# Patient Record
Sex: Female | Born: 1945
Health system: Southern US, Community
[De-identification: ages and names within clinical notes are randomized; demographics above are authoritative.]

## PROBLEM LIST (undated history)

## (undated) DIAGNOSIS — E039 Hypothyroidism, unspecified: Secondary | ICD-10-CM

## (undated) DIAGNOSIS — Z5189 Encounter for other specified aftercare: Secondary | ICD-10-CM

## (undated) DIAGNOSIS — I10 Essential (primary) hypertension: Secondary | ICD-10-CM

## (undated) DIAGNOSIS — F329 Major depressive disorder, single episode, unspecified: Secondary | ICD-10-CM

## (undated) DIAGNOSIS — K589 Irritable bowel syndrome without diarrhea: Secondary | ICD-10-CM

## (undated) DIAGNOSIS — R7611 Nonspecific reaction to tuberculin skin test without active tuberculosis: Secondary | ICD-10-CM

## (undated) DIAGNOSIS — M1990 Unspecified osteoarthritis, unspecified site: Secondary | ICD-10-CM

## (undated) DIAGNOSIS — F32A Depression, unspecified: Secondary | ICD-10-CM

## (undated) DIAGNOSIS — IMO0001 Reserved for inherently not codable concepts without codable children: Secondary | ICD-10-CM

## (undated) DIAGNOSIS — R413 Other amnesia: Principal | ICD-10-CM

## (undated) DIAGNOSIS — Z8349 Family history of other endocrine, nutritional and metabolic diseases: Secondary | ICD-10-CM

## (undated) DIAGNOSIS — F419 Anxiety disorder, unspecified: Secondary | ICD-10-CM

## (undated) DIAGNOSIS — B019 Varicella without complication: Secondary | ICD-10-CM

## (undated) HISTORY — DX: Major depressive disorder, single episode, unspecified: F32.9

## (undated) HISTORY — PX: ADENOIDECTOMY: SUR15

## (undated) HISTORY — DX: Nonspecific reaction to tuberculin skin test without active tuberculosis: R76.11

## (undated) HISTORY — DX: Other amnesia: R41.3

## (undated) HISTORY — DX: Unspecified osteoarthritis, unspecified site: M19.90

## (undated) HISTORY — DX: Family history of other endocrine, nutritional and metabolic diseases: Z83.49

## (undated) HISTORY — DX: Encounter for other specified aftercare: Z51.89

## (undated) HISTORY — PX: OTHER SURGICAL HISTORY: SHX169

## (undated) HISTORY — DX: Depression, unspecified: F32.A

## (undated) HISTORY — DX: Anxiety disorder, unspecified: F41.9

## (undated) HISTORY — DX: Reserved for inherently not codable concepts without codable children: IMO0001

## (undated) HISTORY — DX: Essential (primary) hypertension: I10

## (undated) HISTORY — PX: DILATION AND CURETTAGE OF UTERUS: SHX78

## (undated) HISTORY — DX: Irritable bowel syndrome, unspecified: K58.9

## (undated) HISTORY — DX: Varicella without complication: B01.9

---
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
## 1966-09-23 HISTORY — PX: THYROID SURGERY: SHX805

## 2003-08-07 ENCOUNTER — Ambulatory Visit (HOSPITAL_COMMUNITY): Admission: RE | Admit: 2003-08-07 | Discharge: 2003-08-07 | Payer: Self-pay | Admitting: Nephrology

## 2005-05-23 ENCOUNTER — Encounter: Admission: RE | Admit: 2005-05-23 | Discharge: 2005-05-23 | Payer: Self-pay | Admitting: Nephrology

## 2005-10-27 ENCOUNTER — Encounter: Admission: RE | Admit: 2005-10-27 | Discharge: 2005-10-27 | Payer: Self-pay | Admitting: Nephrology

## 2010-10-13 ENCOUNTER — Encounter: Payer: Self-pay | Admitting: Nephrology

## 2012-05-22 ENCOUNTER — Ambulatory Visit (INDEPENDENT_AMBULATORY_CARE_PROVIDER_SITE_OTHER): Payer: BC Managed Care – PPO | Admitting: Family Medicine

## 2012-05-22 ENCOUNTER — Encounter: Payer: Self-pay | Admitting: Family Medicine

## 2012-05-22 VITALS — BP 121/75 | HR 61 | Temp 98.2°F | Ht 63.5 in | Wt 172.3 lb

## 2012-05-22 DIAGNOSIS — E039 Hypothyroidism, unspecified: Secondary | ICD-10-CM

## 2012-05-22 DIAGNOSIS — I10 Essential (primary) hypertension: Secondary | ICD-10-CM

## 2012-05-22 DIAGNOSIS — F339 Major depressive disorder, recurrent, unspecified: Secondary | ICD-10-CM

## 2012-05-22 DIAGNOSIS — M2550 Pain in unspecified joint: Secondary | ICD-10-CM

## 2012-05-22 LAB — CBC WITH DIFFERENTIAL/PLATELET
Basophils Absolute: 0 10*3/uL (ref 0.0–0.1)
Basophils Relative: 0.3 % (ref 0.0–3.0)
Eosinophils Absolute: 0.1 10*3/uL (ref 0.0–0.7)
Eosinophils Relative: 2.6 % (ref 0.0–5.0)
HCT: 40.1 % (ref 36.0–46.0)
Hemoglobin: 13.3 g/dL (ref 12.0–15.0)
Lymphocytes Relative: 26.7 % (ref 12.0–46.0)
Lymphs Abs: 1.5 10*3/uL (ref 0.7–4.0)
MCHC: 33.2 g/dL (ref 30.0–36.0)
MCV: 89.3 fl (ref 78.0–100.0)
Monocytes Absolute: 0.4 10*3/uL (ref 0.1–1.0)
Monocytes Relative: 7.9 % (ref 3.0–12.0)
Neutro Abs: 3.4 10*3/uL (ref 1.4–7.7)
Neutrophils Relative %: 62.5 % (ref 43.0–77.0)
Platelets: 197 10*3/uL (ref 150.0–400.0)
RBC: 4.49 Mil/uL (ref 3.87–5.11)
RDW: 14.5 % (ref 11.5–14.6)
WBC: 5.4 10*3/uL (ref 4.5–10.5)

## 2012-05-22 LAB — HEPATIC FUNCTION PANEL
ALT: 14 U/L (ref 0–35)
AST: 18 U/L (ref 0–37)
Albumin: 3.7 g/dL (ref 3.5–5.2)
Alkaline Phosphatase: 64 U/L (ref 39–117)
Bilirubin, Direct: 0 mg/dL (ref 0.0–0.3)
Total Bilirubin: 0.5 mg/dL (ref 0.3–1.2)
Total Protein: 6.4 g/dL (ref 6.0–8.3)

## 2012-05-22 LAB — BASIC METABOLIC PANEL
BUN: 7 mg/dL (ref 6–23)
CO2: 29 mEq/L (ref 19–32)
Calcium: 9 mg/dL (ref 8.4–10.5)
Chloride: 103 mEq/L (ref 96–112)
Creatinine, Ser: 0.7 mg/dL (ref 0.4–1.2)
GFR: 83.21 mL/min (ref >60.00)
Glucose, Bld: 90 mg/dL (ref 70–99)
Potassium: 3.7 mEq/L (ref 3.5–5.1)
Sodium: 140 mEq/L (ref 135–145)

## 2012-05-22 LAB — LIPID PANEL
Cholesterol: 189 mg/dL (ref 0–200)
HDL: 44 mg/dL (ref >39.00)
LDL Cholesterol: 116 mg/dL — ABNORMAL HIGH (ref 0–99)
Total CHOL/HDL Ratio: 4
Triglycerides: 145 mg/dL (ref 0.0–149.0)
VLDL: 29 mg/dL (ref 0.0–40.0)

## 2012-05-22 LAB — TSH: TSH: 2.84 u[IU]/mL (ref 0.35–5.50)

## 2012-05-22 MED ORDER — CITALOPRAM HYDROBROMIDE 20 MG PO TABS: 20.0000 mg | ORAL_TABLET | Freq: Every day | ORAL | Status: DC

## 2012-05-22 NOTE — Progress Notes (Signed)
  Subjective:    Patient ID: Christina [name], female    DOB: 1946/07/30, 66 y.o.   MRN: [id number]  HPI New to establish.  Previous MD- Zenda Alpers [phone], [phone]).  Renal- [place].    HTN- chronic problem, on amlodipine 5 and atenolol 25mg .  Has been out of amlodipine for a couple of months.  Pt reports BP is labile due to 'whatever stress i'm under'.  Has recently lost 15 lbs.  BP well controlled in office.  No HA, CP, SOB, visual changes, edema.  Hypothyroid- s/p partial thyroidectomy.  Previously on [place] thyroid but has not been on meds since her MD left town.  + fatigue, denies cold intolerance.  + brittle nails, dry hair.  Depression- chronic problem, pt and sister report pt is 'just a negative person'.  Not currently on meds.  Has previously taken meds but can't remember names- sister thinks it was celexa.  Pt and sister feel that mood was improved on Celexa.  Joint pains- currently pt is having L hip pain that is radiating into knee and ankle.  Pain is not constant but is worse in AM and evening.  Using infrared w/ good pain relief.  Will occasionally take ibuprofen 'but it has to get real bad'.  Refuses to take tylenol due to side effects.  Health maintenance- overdue on mammo, colonoscopy, pap, DEXA.  Is open to scheduling these.   Review of Systems For ROS see HPI     Objective:   Physical Exam  Constitutional: She is oriented to person, place, and time. She appears well-developed and well-nourished. No distress.  HENT:  Head: Normocephalic and atraumatic.  Eyes: Conjunctivae and EOM are normal. Pupils are equal, round, and reactive to light.  Neck: Normal range of motion. Neck supple. No thyromegaly present.  Cardiovascular: Normal rate, regular rhythm, normal heart sounds and intact distal pulses.   No murmur heard. Pulmonary/Chest: Effort normal and breath sounds normal. No respiratory distress.  Abdominal: Soft. She exhibits no distension. There is no  tenderness.  Musculoskeletal: She exhibits no edema.  Lymphadenopathy:    She has no cervical adenopathy.  Neurological: She is alert and oriented to person, place, and time.  Skin: Skin is warm and dry.  Psychiatric: Her behavior is normal.       Odd affect.  Defers to her sister to answer questions.          Assessment & Plan:

## 2012-05-22 NOTE — Patient Instructions (Addendum)
Follow up in 4-6 weeks to recheck your mood Start the Celexa daily We'll notify you of your lab results and determine the best med to take for your joint pains Based on your labs we'll determine whether you need to resume thyroid medication Continue the Atenolol- hold the Amlodipine since your BP looked so good Call with any questions or concerns Happy Early [place] Ouch!

## 2012-05-29 NOTE — Assessment & Plan Note (Signed)
New to provider, ongoing for pt.  Sister feels that SSRI would be beneficial in controlling pt's sxs and improving quality of life.  Will start low dose med and follow closely.

## 2012-05-29 NOTE — Assessment & Plan Note (Signed)
New to provider, chronic for pt.  Taking Atenolol.  Previously on amlodipine but has been out of this and BP remains at goal.  Asymptomatic.  No changes at this time.  Will continue to follow at future visits.

## 2012-05-29 NOTE — Assessment & Plan Note (Signed)
New to provider, recurrent for pt.  Pt unwilling to take tylenol due to risk of liver failure.  Reviewed that NSAIDs can impact kidneys if taken improperly and reviewed safe doses of tylenol and that when taken this way, she has nothing to worry about.  Will follow at future visits.

## 2012-05-29 NOTE — Assessment & Plan Note (Signed)
New to provider, chronic for pt since partial thyroidectomy.  Check labs and determine whether thyroid replacement is necessary.

## 2012-06-26 ENCOUNTER — Ambulatory Visit: Payer: BC Managed Care – PPO | Admitting: Family Medicine

## 2012-10-15 ENCOUNTER — Emergency Department (HOSPITAL_COMMUNITY)
Admission: EM | Admit: 2012-10-15 | Discharge: 2012-10-15 | Disposition: A | Discharge status: Home / Self Care | Payer: Worker's Compensation | Attending: Emergency Medicine | Admitting: Emergency Medicine

## 2012-10-15 ENCOUNTER — Emergency Department (HOSPITAL_COMMUNITY): Payer: Worker's Compensation

## 2012-10-15 ENCOUNTER — Encounter (HOSPITAL_COMMUNITY): Payer: Self-pay | Admitting: *Deleted

## 2012-10-15 DIAGNOSIS — Z8739 Personal history of other diseases of the musculoskeletal system and connective tissue: Secondary | ICD-10-CM | POA: Insufficient documentation

## 2012-10-15 DIAGNOSIS — W010XXA Fall on same level from slipping, tripping and stumbling without subsequent striking against object, initial encounter: Secondary | ICD-10-CM | POA: Insufficient documentation

## 2012-10-15 DIAGNOSIS — Z7982 Long term (current) use of aspirin: Secondary | ICD-10-CM | POA: Insufficient documentation

## 2012-10-15 DIAGNOSIS — Z79899 Other long term (current) drug therapy: Secondary | ICD-10-CM | POA: Insufficient documentation

## 2012-10-15 DIAGNOSIS — F3289 Other specified depressive episodes: Secondary | ICD-10-CM | POA: Insufficient documentation

## 2012-10-15 DIAGNOSIS — M25469 Effusion, unspecified knee: Secondary | ICD-10-CM | POA: Insufficient documentation

## 2012-10-15 DIAGNOSIS — F329 Major depressive disorder, single episode, unspecified: Secondary | ICD-10-CM | POA: Insufficient documentation

## 2012-10-15 DIAGNOSIS — Z8611 Personal history of tuberculosis: Secondary | ICD-10-CM | POA: Insufficient documentation

## 2012-10-15 DIAGNOSIS — S82109A Unspecified fracture of upper end of unspecified tibia, initial encounter for closed fracture: Secondary | ICD-10-CM | POA: Insufficient documentation

## 2012-10-15 DIAGNOSIS — S82102A Unspecified fracture of upper end of left tibia, initial encounter for closed fracture: Secondary | ICD-10-CM

## 2012-10-15 DIAGNOSIS — M25462 Effusion, left knee: Secondary | ICD-10-CM

## 2012-10-15 DIAGNOSIS — Y939 Activity, unspecified: Secondary | ICD-10-CM | POA: Insufficient documentation

## 2012-10-15 DIAGNOSIS — Z8619 Personal history of other infectious and parasitic diseases: Secondary | ICD-10-CM | POA: Insufficient documentation

## 2012-10-15 DIAGNOSIS — Y929 Unspecified place or not applicable: Secondary | ICD-10-CM | POA: Insufficient documentation

## 2012-10-15 MED ORDER — TRAMADOL HCL 50 MG PO TABS: 50.0000 mg | ORAL_TABLET | Freq: Four times a day (QID) | ORAL | Status: DC | PRN

## 2012-10-15 NOTE — ED Notes (Signed)
Patient transported to X-ray 

## 2012-10-15 NOTE — ED Notes (Signed)
To ED for eval of left knee pain since last PM. Pt tripped over box and fell. Ambulatory with pain. Minimal to moderate swelling noted. Denies further injury

## 2012-10-15 NOTE — ED Notes (Signed)
Returned from xray

## 2012-10-15 NOTE — ED Provider Notes (Signed)
Medical screening examination/treatment/procedure(s) were performed by non-physician practitioner and as supervising physician I was immediately available for consultation/collaboration.   Devonne Kitchen III, MD 10/15/12 1958 

## 2012-10-15 NOTE — ED Provider Notes (Signed)
History     CSN: [id number]  Arrival date & time 10/15/12  4098   First MD Initiated Contact with Patient 10/15/12 1101      Chief Complaint  Patient presents with  . Knee Pain    (Consider location/radiation/quality/duration/timing/severity/associated sxs/prior treatment) Patient is a 67 y.o. female presenting with knee pain. The history is provided by the patient. No language interpreter was used.  Knee Pain This is a new problem. The current episode started yesterday. The problem occurs constantly. The problem has been gradually worsening. Associated symptoms include arthralgias. Pertinent negatives include no abdominal pain, chest pain, fever, neck pain, rash or weakness. The symptoms are aggravated by standing and walking. She has tried NSAIDs and rest for the symptoms. The treatment provided mild relief.      Past Medical History  Diagnosis Date  . Arthritis   . Chicken pox   . Depression   . Positive TB test   . Blood transfusion   . Family history of thyroid problem     Past Surgical History  Procedure Date  . Tonsillectomy 1951  . Thyroid surgery 1968    Family History  Problem Relation Age of Onset  . Arthritis Mother   . Hyperlipidemia Mother   . Hypertension Mother   . Arthritis Father   . Prostate cancer Father   . Hypertension Father   . Arthritis Maternal Grandmother   . Hypertension Maternal Grandmother   . Arthritis Maternal Grandfather   . Hypertension Maternal Grandfather   . Arthritis Paternal Grandmother   . Hypertension Paternal Grandmother   . Arthritis Paternal Grandfather   . Hypertension Paternal Grandfather   . Diabetes Paternal Grandfather     History  Substance Use Topics  . Smoking status: Never Smoker   . Smokeless tobacco: Not on file  . Alcohol Use: Yes     Comment: occasionally    OB History    Grav Para Term Preterm Abortions TAB SAB Ect Mult Living                  Review of Systems  Constitutional: Negative  for fever.       10 Systems reviewed and all are negative for acute change except as noted in the HPI.   HENT: Negative for neck pain.   Cardiovascular: Negative for chest pain.  Gastrointestinal: Negative for abdominal pain.  Musculoskeletal: Positive for arthralgias.  Skin: Negative for rash.  Neurological: Negative for weakness.    Allergies  Celexa; Synthroid; and Valium  Home Medications   Current Outpatient Rx  Name  Route  Sig  Dispense  Refill  . ACAI 500 MG PO CAPS   Oral   Take 2 capsules by mouth daily.         . ASPIRIN 325 MG PO TABS   Oral   Take 325 mg by mouth daily.         . ATENOLOL 25 MG PO TABS   Oral   Take 25 mg by mouth daily.         Marland Kitchen BETA CAROTENE 15 MG PO CAPS   Oral   Take 15 mg by mouth daily.         Marland Kitchen VITAMIN D 1000 UNITS PO TABS   Oral   Take 1,000 Units by mouth daily.         Marland Kitchen CINNAMON 500 MG PO CAPS   Oral   Take 1,000 mg by mouth daily.         Marland Kitchen  FOLIC ACID 800 MCG PO TABS   Oral   Take 800 mcg by mouth daily.          . CULTURELLE DIGESTIVE HEALTH PO   Oral   Take 1 capsule by mouth.         . MELATONIN 3 MG PO CAPS   Oral   Take 1 capsule by mouth daily.         . ADRENAL PO   Oral   Take 1 capsule by mouth daily.         . ADULT MULTIVITAMIN W/MINERALS CH   Oral   Take 1 tablet by mouth daily.         . SELENIUM 50 MCG PO TABS   Oral   Take 200 mcg by mouth daily.         Marland Kitchen VITAMIN B-1 100 MG PO TABS   Oral   Take 100 mg by mouth daily.         . THYROID PO   Oral   Take 1 capsule by mouth.         Marland Kitchen VITAMIN E 400 UNITS PO CAPS   Oral   Take 400 Units by mouth daily.           BP 124/83  Pulse 68  Temp 98.1 F (36.7 C) (Oral)  Resp 18  SpO2 97%  Physical Exam  Nursing note and vitals reviewed. Constitutional: She is oriented to person, place, and time. She appears well-developed and well-nourished. No distress.  HENT:  Head: Atraumatic.  Eyes: Conjunctivae  normal are normal.  Neck: Neck supple.  Musculoskeletal:       Left hip: Normal.       Left knee: She exhibits decreased range of motion, swelling and effusion. She exhibits no ecchymosis, no deformity, no laceration, no erythema and normal patellar mobility. tenderness found.       Left ankle: Normal.  Neurological: She is alert and oriented to person, place, and time.  Skin: Skin is warm. No rash noted.  Psychiatric: She has a normal mood and affect.    ED Course  Procedures (including critical care time)  Labs Reviewed - No data to display Dg Knee Complete 4 Views Left  10/15/2012  *RADIOLOGY REPORT*  Clinical Data: Left knee pain and swelling secondary to a fall.  LEFT KNEE - COMPLETE 4+ VIEW  Comparison: None.  Findings: There is a large avulsion from the posterior aspect of the proximal tibia seen only on the lateral view.  There is a prominent joint effusion.  No other abnormalities.  IMPRESSION: Avulsion fracture of the posterior aspect of the proximal tibia. Large joint effusion.   Original Report Authenticated By: Francene Boyers, M.D.      No diagnosis found.  1. L tibia fracture 2. L knee effusion  MDM  To ED for eval of left knee pain since last PM. Pt tripped over box and fell. Ambulatory with pain. Minimal to moderate swelling noted. Denies further injury  12:19 PM Did not hit head, no LOC, no precipitating sxs.  Able to ambulate, took aspirin  Xray of L knee reviewed by me shows avulsion fx of the posterior aspect of the proximal tibia with large joint effusion.    Result were discussed with pt.  Pain medication offered, pt declined.  ICE pack given.  Will offer knee immobilizer and crutches with ortho f/u.    BP 139/73  Pulse 69  Temp 98.7 F (37.1 C) (  Oral)  Resp 16  SpO2 96%  I have reviewed nursing notes and vital signs. I personally reviewed the imaging tests through PACS system  I reviewed available ER/hospitalization records thought the  EMR       Fayrene Helper, [place] 10/15/12 1248

## 2012-10-15 NOTE — Progress Notes (Signed)
Orthopedic Tech Progress Note Patient Details:  Christina [name] 09-07-1946 [id number] Ace wrap and knee immobilizer applied to Left LE. Crutches fitted for height and comfort.  Ortho Devices Type of Ortho Device: Ace wrap;Crutches;Knee Immobilizer Ortho Device/Splint Location: Left Ortho Device/Splint Interventions: Application   Asia R Thompson 10/15/2012, 1:06 PM

## 2013-12-15 ENCOUNTER — Encounter: Payer: Self-pay | Admitting: Neurology

## 2013-12-16 ENCOUNTER — Ambulatory Visit (INDEPENDENT_AMBULATORY_CARE_PROVIDER_SITE_OTHER): Payer: BC Managed Care – PPO | Admitting: Neurology

## 2013-12-16 ENCOUNTER — Encounter (INDEPENDENT_AMBULATORY_CARE_PROVIDER_SITE_OTHER): Payer: Self-pay

## 2013-12-16 ENCOUNTER — Encounter: Payer: Self-pay | Admitting: Neurology

## 2013-12-16 VITALS — BP 146/84 | HR 54 | Ht 64.0 in | Wt 164.0 lb

## 2013-12-16 DIAGNOSIS — R413 Other amnesia: Secondary | ICD-10-CM

## 2013-12-16 HISTORY — DX: Other amnesia: R41.3

## 2013-12-16 NOTE — Progress Notes (Signed)
Reason for visit: Memory disorder  Christina [name] is a 68 y.o. female  History of present illness:  Ms. Grussing is a 68 year old right-handed white female with a history of problems with memory dating back to work four years. The patient lives alone, but she has a friend who comes in and helps her. The patient indicates that she has developed issues with short-term and long-term memory. The patient has difficulty remembering events that occurred within the last week, but she also has problems remembering things that happened 20 years ago. The patient still operates a motor vehicle, and she may have some difficulty with directions. The patient is still working, and apparently is functioning relatively well in this regard. The patient misplaces things about the house, but she is able to do her finances without difficulty. The patient is able to manage her medications and appointments. The patient does have a history of fatigue that is almost lifelong. The patient indicates that she sleeps fairly well, but she indicates that she does not rest well at night. The patient denies any significant excessive daytime drowsiness, however. The patient does report some numbness in the hands more so than the feet. The patient denies any headaches, blackout episodes, or focal numbness or weakness of the extremities, with exception of the hand issue as above. The patient has some mild gait instability, no falls. The patient denies problems controlling the bowels or the bladder. She is sent to this office for further evaluation. Blood work to include a thyroid and a B12 level were unremarkable. The patient does report issues with depression for quite a number of years. The friend that accompanies her indicates that she will need help doing her hair, or straightening her clothes, as she appears to not be concerned about a disheveled appearance at times.  Past Medical History  Diagnosis Date  . Arthritis   . Chicken  pox   . Depression   . Positive TB test   . Blood transfusion   . Family history of thyroid problem   . HTN (hypertension)   . Memory deficits 12/16/2013    Past Surgical History  Procedure Laterality Date  . Tonsillectomy  1951  . Thyroid surgery  1968  . Adenoidectomy    . D [place]    . Dilation and curettage of uterus      Family History  Problem Relation Age of Onset  . Arthritis Mother   . Hyperlipidemia Mother   . Hypertension Mother   . Dementia Mother   . Arthritis Father   . Prostate cancer Father   . Hypertension Father   . Arthritis Maternal Grandmother   . Hypertension Maternal Grandmother   . Arthritis Maternal Grandfather   . Hypertension Maternal Grandfather   . Arthritis Paternal Grandmother   . Hypertension Paternal Grandmother   . Arthritis Paternal Grandfather   . Hypertension Paternal Grandfather   . Diabetes Paternal Grandfather   . Heart disease Sister     congenital heart disease  . Stroke Sister   . Dementia Sister   . Cancer - Colon Brother   . Stroke Brother   . Pulmonary embolism Brother   . Stroke Brother   . Gout Brother     Social history:  reports that she has never smoked. She has never used smokeless tobacco. She reports that she drinks alcohol. She reports that she does not use illicit drugs.  Medications:  Current Outpatient Prescriptions on File Prior to Visit  Medication Sig  Dispense Refill  . Acai 500 MG CAPS Take 2 capsules by mouth daily.      Marland Kitchen aspirin 325 MG tablet Take 325 mg by mouth daily as needed.       Marland Kitchen atenolol (TENORMIN) 25 MG tablet Take 25 mg by mouth daily.      . beta carotene 15 MG capsule Take 15 mg by mouth daily.      . cholecalciferol (VITAMIN D) 1000 UNITS tablet Take 2,000 Units by mouth daily.       . folic acid (FOLVITE) 453 MCG tablet Take 800 mcg by mouth daily.       . Lactobacillus-Inulin (CULTURELLE DIGESTIVE HEALTH PO) Take 1 capsule by mouth.      . Lecithin 1200 MG CAPS Take 1 capsule by  mouth daily.      . Melatonin 3 MG CAPS Take 1 capsule by mouth daily.      . Misc Natural Products (ADRENAL PO) Take 1 capsule by mouth daily.      . Multiple Vitamin (MULTIVITAMIN WITH MINERALS) TABS Take 1 tablet by mouth daily.      Marland Kitchen selenium 50 MCG TABS Take 200 mcg by mouth daily.      Marland Kitchen thiamine (VITAMIN B-1) 100 MG tablet Take 100 mg by mouth daily.      . THYROID PO Take 1 capsule by mouth.      . traMADol (ULTRAM) 50 MG tablet Take 1 tablet (50 mg total) by mouth every 6 (six) hours as needed for pain.  15 tablet  0  . vitamin E 400 UNIT capsule Take 400 Units by mouth daily.       No current facility-administered medications on file prior to visit.      Allergies  Allergen Reactions  . Celexa [Citalopram Hydrobromide] Other (See Comments)    GENERIC BRAND makes pt nervous  . Synthroid [Levothyroxine Sodium]     Shakes   . Valium [Diazepam]     Pt became addicted. Not an allergy    ROS:  Out of a complete 14 system review of symptoms, the patient complains only of the following symptoms, and all other reviewed systems are negative.  Weight gain, fatigue Swelling in the legs Hearing loss, ringing in the ears Snoring Diarrhea, constipation Easy bruising Feeling hot, cold, increased thirst Joint pain, joint swelling, muscle cramps, achy muscles Allergies, runny nose Memory loss, confusion, numbness Depression, anxiety, insomnia, disinterest in activities, suicidal thoughts, racing thoughts Sleepiness  Blood pressure 146/84, pulse 54, height 5\' 4"  (1.626 m), weight 164 lb (74.39 kg).  Physical Exam  General: The patient is alert and cooperative at the time of the examination.  Eyes: Pupils are equal, round, and reactive to light. Discs are flat bilaterally.  Neck: The neck is supple, no carotid bruits are noted.  Respiratory: The respiratory examination is clear.  Cardiovascular: The cardiovascular examination reveals a regular rate and rhythm, no obvious  murmurs or rubs are noted.  Skin: Extremities are without significant edema.  Neurologic Exam  Mental status: The patient is alert and oriented x 3 at the time of the examination. The patient has apparent normal recent and remote memory, with an apparently normal attention span and concentration ability. Mini-Mental status examination done today shows a total score of 28/30.  Cranial nerves: Facial symmetry is present. There is good sensation of the face to pinprick and soft touch bilaterally. The strength of the facial muscles and the muscles to head turning and shoulder shrug  are normal bilaterally. Speech is well enunciated, no aphasia or dysarthria is noted. Extraocular movements are full. Visual fields are full. The tongue is midline, and the patient has symmetric elevation of the soft palate. No obvious hearing deficits are noted.  Motor: The motor testing reveals 5 over 5 strength of all 4 extremities. Good symmetric motor tone is noted throughout.  Sensory: Sensory testing is intact to pinprick, soft touch, vibration sensation, and position sense on all 4 extremities. No evidence of extinction is noted.  Coordination: Cerebellar testing reveals good finger-nose-finger and heel-to-shin bilaterally.  Gait and station: Gait is normal. Tandem gait is normal. Romberg is negative. No drift is seen.  Reflexes: Deep tendon reflexes are symmetric and normal bilaterally. Toes are downgoing bilaterally.   Assessment/Plan:  1. Reported memory disorder  2. Underlying depression  The patient has an atypical presentation for dementia. The patient has short-term and long-term memory issues, and clinical examination shows no evidence of apraxia that is commonly present with an organic memory disorder. 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 The patient will be set up for MRI evaluation of the brain, and she will be sent for neuropsychological testing. The patient will followup in 6  months. We will follow her memory issues over time.  Jill Alexanders MD 12/16/2013 9:01 AM  [facility] [address] [place], [place] [address]  Phone [phone] Fax [phone]

## 2014-02-10 ENCOUNTER — Ambulatory Visit
Admission: RE | Admit: 2014-02-10 | Discharge: 2014-02-10 | Disposition: A | Discharge status: Home / Self Care | Payer: BC Managed Care – PPO | Source: Ambulatory Visit | Attending: Neurology | Admitting: Neurology

## 2014-02-10 DIAGNOSIS — R413 Other amnesia: Secondary | ICD-10-CM

## 2014-02-11 ENCOUNTER — Telehealth: Payer: Self-pay | Admitting: Neurology

## 2014-02-11 NOTE — Telephone Encounter (Signed)
I called patient. The MRI of the brain shows minimal small vessel changes. The patient was sent for neuropsychological evaluation, I have not gotten the results of this yet.   MRI brain 02/10/14:  Impression   Slightly abnormal MRI scan of the brain showing mild changes  of chronic microvascular ischemia. These appears slightly progressed  compared with his MRI scan dated 10/27/2005

## 2014-04-07 DIAGNOSIS — R413 Other amnesia: Secondary | ICD-10-CM | POA: Diagnosis not present

## 2014-04-28 DIAGNOSIS — R413 Other amnesia: Secondary | ICD-10-CM | POA: Diagnosis not present

## 2014-05-10 ENCOUNTER — Telehealth: Payer: Self-pay | Admitting: Neurology

## 2014-05-10 NOTE — Telephone Encounter (Signed)
The neuropsychological evaluation done shows no significant cognitive abnormalities, there is some suggestion of significant underlying psychological distress, and psychiatric followup was recommended.  If the patient desires to have psychological or psychiatric evaluation and followup, she is to contact our office.

## 2014-06-23 ENCOUNTER — Encounter (INDEPENDENT_AMBULATORY_CARE_PROVIDER_SITE_OTHER): Payer: Self-pay

## 2014-06-23 ENCOUNTER — Ambulatory Visit (INDEPENDENT_AMBULATORY_CARE_PROVIDER_SITE_OTHER): Payer: BC Managed Care – PPO | Admitting: Adult Health

## 2014-06-23 ENCOUNTER — Encounter: Payer: Self-pay | Admitting: Adult Health

## 2014-06-23 ENCOUNTER — Encounter: Payer: Self-pay | Admitting: *Deleted

## 2014-06-23 VITALS — BP 171/100 | HR 56 | Ht 64.0 in | Wt 163.0 lb

## 2014-06-23 DIAGNOSIS — R413 Other amnesia: Secondary | ICD-10-CM

## 2014-06-23 NOTE — Patient Instructions (Signed)
We have made a referral to psychiatry for an underlying psychological distress. Our office will call you with an appointment

## 2014-06-23 NOTE — Progress Notes (Signed)
PATIENT: Christina [name] DOB: 02/28/1946  REASON FOR VISIT: follow up HISTORY FROM: patient  HISTORY OF PRESENT ILLNESS: Christina [name] is a 68 year old female with a history of memory loss. She returns today for follow-up. She had an MRI that showed minimal small vessel changes. She was also sent for neuropsychological testing that indicated a possible underlying psychological distress. She states that her memory improves for a while and then it will worsen. She states that she has a lot of stress at her job. She continues to live alone. She does her own finances without difficulty. She is able to complete all ADLs without difficulty. Denies having to give up doing anything due to her memory. She operates a Teacher, music without difficulty.    HISTORY 12/16/13 (CW): 68 year old right-handed white female with a history of problems with memory dating back to work four years. The patient lives alone, but she has a friend who comes in and helps her. The patient indicates that she has developed issues with short-term and long-term memory. The patient has difficulty remembering events that occurred within the last week, but she also has problems remembering things that happened 20 years ago. The patient still operates a motor vehicle, and she may have some difficulty with directions. The patient is still working, and apparently is functioning relatively well in this regard. The patient misplaces things about the house, but she is able to do her finances without difficulty. The patient is able to manage her medications and appointments. The patient does have a history of fatigue that is almost lifelong. The patient indicates that she sleeps fairly well, but she indicates that she does not rest well at night. The patient denies any significant excessive daytime drowsiness, however. The patient does report some numbness in the hands more so than the feet. The patient denies any headaches, blackout episodes, or  focal numbness or weakness of the extremities, with exception of the hand issue as above. The patient has some mild gait instability, no falls. The patient denies problems controlling the bowels or the bladder. She is sent to this office for further evaluation. Blood work to include a thyroid and a B12 level were unremarkable. The patient does report issues with depression for quite a number of years. The friend that accompanies her indicates that she will need help doing her hair, or straightening her clothes, as she appears to not be concerned about a disheveled appearance at times.   REVIEW OF SYSTEMS: Full 14 system review of systems performed and notable only for:  Constitutional: Fatigue Eyes: N/A Ear/Nose/Throat: Hearing loss, ringing in the ears  Skin: N/A  Cardiovascular: Leg swelling  Respiratory: N/A  Gastrointestinal: Constipation, diarrhea  Genitourinary: N/A Hematology/Lymphatic: N/A  Endocrine: Excessive eating Musculoskeletal: Joint pain, joint swelling, aching muscles, muscle cramps Allergy/Immunology: N/A  Neurological: Memory loss, numbness Psychiatric: Agitation, behavior problem, confusion, decreased concentration, depression, nervous/anxious Sleep: Daytime sleepiness, snoring   ALLERGIES: Allergies  Allergen Reactions  . Celexa [Citalopram Hydrobromide] Other (See Comments)    GENERIC BRAND makes pt nervous  . Synthroid [Levothyroxine Sodium]     Shakes   . Valium [Diazepam]     Pt became addicted. Not an allergy    HOME MEDICATIONS: Outpatient Prescriptions Prior to Visit  Medication Sig Dispense Refill  . Acai 500 MG CAPS Take 2 capsules by mouth daily.      Marland Kitchen aspirin 325 MG tablet Take 325 mg by mouth daily as needed.       Marland Kitchen  atenolol (TENORMIN) 25 MG tablet Take 25 mg by mouth daily.      . beta carotene 15 MG capsule Take 15 mg by mouth daily.      . cholecalciferol (VITAMIN D) 1000 UNITS tablet Take 2,000 Units by mouth daily.       . folic acid  (FOLVITE) 161 MCG tablet Take 800 mcg by mouth daily.       . Lactobacillus-Inulin (CULTURELLE DIGESTIVE HEALTH PO) Take 1 capsule by mouth.      . Lecithin 1200 MG CAPS Take 1 capsule by mouth daily.      . Melatonin 3 MG CAPS Take 1 capsule by mouth daily.      . Misc Natural Products (ADRENAL PO) Take 1 capsule by mouth daily.      . Multiple Vitamin (MULTIVITAMIN WITH MINERALS) TABS Take 1 tablet by mouth daily.      Marland Kitchen selenium 50 MCG TABS Take 200 mcg by mouth daily.      Marland Kitchen thiamine (VITAMIN B-1) 100 MG tablet Take 100 mg by mouth daily.      . THYROID PO Take 1 capsule by mouth.      . traMADol (ULTRAM) 50 MG tablet Take 1 tablet (50 mg total) by mouth every 6 (six) hours as needed for pain.  15 tablet  0  . vitamin E 400 UNIT capsule Take 400 Units by mouth daily.       No facility-administered medications prior to visit.    PAST MEDICAL HISTORY: Past Medical History  Diagnosis Date  . Arthritis   . Chicken pox   . Depression   . Positive TB test   . Blood transfusion   . Family history of thyroid problem   . HTN (hypertension)   . Memory deficits 12/16/2013  . Irritable bowel syndrome   . Degenerative arthritis     PAST SURGICAL HISTORY: Past Surgical History  Procedure Laterality Date  . Tonsillectomy  1951  . Thyroid surgery  1968  . Adenoidectomy    . D [place]    . Dilation and curettage of uterus      FAMILY HISTORY: Family History  Problem Relation Age of Onset  . Arthritis Mother   . Hyperlipidemia Mother   . Hypertension Mother   . Dementia Mother   . Arthritis Father   . Prostate cancer Father   . Hypertension Father   . Arthritis Maternal Grandmother   . Hypertension Maternal Grandmother   . Arthritis Maternal Grandfather   . Hypertension Maternal Grandfather   . Arthritis Paternal Grandmother   . Hypertension Paternal Grandmother   . Arthritis Paternal Grandfather   . Hypertension Paternal Grandfather   . Diabetes Paternal Grandfather   . Heart  disease Sister     congenital heart disease  . Stroke Sister   . Dementia Sister   . Cancer - Colon Brother   . Stroke Brother   . Pulmonary embolism Brother   . Stroke Brother   . Gout Brother     SOCIAL HISTORY: History   Social History  . Marital Status: Divorced    Spouse Name: N/A    Number of Children: 63  . Years of Education: college 1   Occupational History  . FLOATER    Social History Main Topics  . Smoking status: Never Smoker   . Smokeless tobacco: Never Used  . Alcohol Use: Yes     Comment: occasionally  . Drug Use: No  . Sexual Activity: Not  on file   Other Topics Concern  . Not on file   Social History Narrative  . No narrative on file      PHYSICAL EXAM  Filed Vitals:   06/23/14 0826  BP: 171/100  Pulse: 56  Height: 5\' 4"  (1.626 m)  Weight: 163 lb (73.936 kg)   Body mass index is 27.97 kg/(m^2).  Generalized: Well developed, in no acute distress   Neurological examination  Mentation: Alert oriented to time, place, history taking. Follows all commands speech and language fluent. MMSE 29/30 Cranial nerve II-XII: Pupils were equal round reactive to light. Extraocular movements were full, visual field were full on confrontational test. Facial sensation and strength were normal. Uvula tongue midline. Head turning and shoulder shrug  were normal and symmetric. Motor: The motor testing reveals 5 over 5 strength of all 4 extremities. Good symmetric motor tone is noted throughout.  Sensory: Sensory testing is intact to soft touch on all 4 extremities. No evidence of extinction is noted.  Coordination: Cerebellar testing reveals good finger-nose-finger and heel-to-shin bilaterally.  Gait and station: Gait is normal. Tandem gait is normal. Romberg is negative. No drift is seen.  Reflexes: Deep tendon reflexes are symmetric and normal bilaterally.  Marland Kitchen   DIAGNOSTIC DATA (LABS, IMAGING, TESTING) - I reviewed patient records, labs, notes, testing and  imaging myself where available.  Lab Results  Component Value Date   WBC 5.4 05/22/2012   HGB 13.3 05/22/2012   HCT 40.1 05/22/2012   MCV 89.3 05/22/2012   PLT 197.0 05/22/2012      Component Value Date/Time   NA 140 05/22/2012 0916   K 3.7 05/22/2012 0916   CL 103 05/22/2012 0916   CO2 29 05/22/2012 0916   GLUCOSE 90 05/22/2012 0916   BUN 7 05/22/2012 0916   CREATININE 0.7 05/22/2012 0916   CALCIUM 9.0 05/22/2012 0916   PROT 6.4 05/22/2012 0916   ALBUMIN 3.7 05/22/2012 0916   AST 18 05/22/2012 0916   ALT 14 05/22/2012 0916   ALKPHOS 64 05/22/2012 0916   BILITOT 0.5 05/22/2012 0916   Lab Results  Component Value Date   CHOL 189 05/22/2012   HDL 44.00 05/22/2012   LDLCALC 116* 05/22/2012   TRIG 145.0 05/22/2012   CHOLHDL 4 05/22/2012    Lab Results  Component Value Date   TSH 2.84 05/22/2012      ASSESSMENT AND PLAN 68 y.o. year old female  has a past medical history of Arthritis; Chicken pox; Depression; Positive TB test; Blood transfusion; Family history of thyroid problem; HTN (hypertension); Memory deficits (12/16/2013); Irritable bowel syndrome; and Degenerative arthritis. here with:  1. memory disturbance  Patient continues to feel that her memory is not always "the best". She did have an MRI that showed minimal small vessel disease. She had neuropsychological testing that indicated a possible underlying psychological distress. The recommendation from the neuropsychological testing was a psychiatric evaluation. I have discussed this with the patient today and she is amendable to being referred to psychiatry. The patient will followup with [place] in 6 months. If her symptoms worsen or she develops new symptoms she should let us know.  Ward Givens, MSN, NP-C 06/23/2014, 8:31 AM [facility] [address], [place], [place] [address] [phone]  Note: This document was prepared with digital dictation and possible smart phrase technology. Any transcriptional  errors that result from this process are unintentional.

## 2014-06-23 NOTE — Progress Notes (Signed)
I have read the note, and I agree with the clinical assessment and plan.  Maclean Foister KEITH   

## 2014-08-10 ENCOUNTER — Encounter: Payer: Self-pay | Admitting: Neurology

## 2014-08-16 ENCOUNTER — Encounter: Payer: Self-pay | Admitting: Neurology

## 2014-12-26 ENCOUNTER — Ambulatory Visit (INDEPENDENT_AMBULATORY_CARE_PROVIDER_SITE_OTHER): Payer: 59 | Admitting: Adult Health

## 2014-12-26 ENCOUNTER — Encounter: Payer: Self-pay | Admitting: Adult Health

## 2014-12-26 VITALS — BP 165/99 | HR 63 | Ht 64.0 in | Wt 168.0 lb

## 2014-12-26 DIAGNOSIS — R413 Other amnesia: Secondary | ICD-10-CM | POA: Diagnosis not present

## 2014-12-26 NOTE — Progress Notes (Signed)
PATIENT: Christina [name] DOB: 04-19-1946  REASON FOR VISIT: follow up- memory HISTORY FROM: patient  HISTORY OF PRESENT ILLNESS Christina [name] is a 69 year old female with a history of memory loss. She returns today for follow-up. The patient was referred to psychiatry after her neuropsychological evaluation. She states that she has an appointment with a therapist on Wednesday. She states that she cannot get appointment with a psychiatrist until October. Patient states that she has good and bad days with her memory. She continues to live alone. She is able to complete all ADLs independently. 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 She denies having to give up anything due to her memory. The patient was started on Wellbutrin since the last visit. She feels that this has helped her mood. She returns today for evaluation.   History 06/23/14:Christina [name] is a 69 year old female with a history of memory loss. She returns today for follow-up. She had an MRI that showed minimal small vessel changes. She was also sent for neuropsychological testing that indicated a possible underlying psychological distress. She states that her memory improves for a while and then it will worsen. She states that she has a lot of stress at her job. She continues to live alone. She does her own finances without difficulty. She is able to complete all ADLs without difficulty. Denies having to give up doing anything due to her memory. She operates a Teacher, music without difficulty.    HISTORY 12/16/13 (CW): 69 year old right-handed white female with a history of problems with memory dating back to work four years. The patient lives alone, but she has a friend who comes in and helps her. The patient indicates that she has developed issues with short-term and long-term memory. The patient has difficulty remembering events that occurred within the last week, but she also has problems  remembering things that happened 20 years ago. The patient still operates a motor vehicle, and she may have some difficulty with directions. The patient is still working, and apparently is functioning relatively well in this regard. The patient misplaces things about the house, but she is able to do her finances without difficulty. The patient is able to manage her medications and appointments. The patient does have a history of fatigue that is almost lifelong. The patient indicates that she sleeps fairly well, but she indicates that she does not rest well at night. The patient denies any significant excessive daytime drowsiness, however. The patient does report some numbness in the hands more so than the feet. The patient denies any headaches, blackout episodes, or focal numbness or weakness of the extremities, with exception of the hand issue as above. The patient has some mild gait instability, no falls. The patient denies problems controlling the bowels or the bladder. She is sent to this office for further evaluation. Blood work to include a thyroid and a B12 level were unremarkable. The patient does report issues with depression for quite a number of years. The friend that accompanies her indicates that she will need help doing her hair, or straightening her clothes, as she appears to not be concerned about a disheveled appearance at times.   REVIEW OF SYSTEMS: Out of a complete 14 system review of symptoms, the patient complains only of the following symptoms, and all other reviewed systems are negative.  Hemorrhoids, frequent waking, daytime sleepiness, snoring, leg swelling, agitation, behavior problem, confusion, decreased concentration, depression, nervous/anxious, memory loss,  tingling  ALLERGIES: Allergies  Allergen Reactions  . Celexa [Citalopram Hydrobromide] Other (See Comments)    GENERIC BRAND makes pt nervous  . Synthroid [Levothyroxine Sodium]     Shakes   . Valium [Diazepam]      Pt became addicted. Not an allergy    HOME MEDICATIONS: Outpatient Prescriptions Prior to Visit  Medication Sig Dispense Refill  . Acai 500 MG CAPS Take 2 capsules by mouth daily.    Marland Kitchen aspirin 325 MG tablet Take 325 mg by mouth daily as needed.     Marland Kitchen atenolol (TENORMIN) 25 MG tablet Take 25 mg by mouth daily.    . beta carotene 15 MG capsule Take 15 mg by mouth daily.    . cholecalciferol (VITAMIN D) 1000 UNITS tablet Take 2,000 Units by mouth daily.     . folic acid (FOLVITE) 240 MCG tablet Take 800 mcg by mouth daily.     . Lactobacillus-Inulin (CULTURELLE DIGESTIVE HEALTH PO) Take 1 capsule by mouth.    . Lecithin 1200 MG CAPS Take 1 capsule by mouth daily.    . Melatonin 3 MG CAPS Take 1 capsule by mouth daily.    . Misc Natural Products (ADRENAL PO) Take 1 capsule by mouth daily.    . Multiple Vitamin (MULTIVITAMIN WITH MINERALS) TABS Take 1 tablet by mouth daily.    Marland Kitchen selenium 50 MCG TABS Take 200 mcg by mouth daily.    Marland Kitchen thiamine (VITAMIN B-1) 100 MG tablet Take 100 mg by mouth daily.    . THYROID PO Take 1 capsule by mouth.    . traMADol (ULTRAM) 50 MG tablet Take 1 tablet (50 mg total) by mouth every 6 (six) hours as needed for pain. 15 tablet 0  . vitamin E 400 UNIT capsule Take 400 Units by mouth daily.     No facility-administered medications prior to visit.    PAST MEDICAL HISTORY: Past Medical History  Diagnosis Date  . Arthritis   . Chicken pox   . Depression   . Positive TB test   . Blood transfusion   . Family history of thyroid problem   . HTN (hypertension)   . Memory deficits 12/16/2013  . Irritable bowel syndrome   . Degenerative arthritis     PAST SURGICAL HISTORY: Past Surgical History  Procedure Laterality Date  . Tonsillectomy  1951  . Thyroid surgery  1968  . Adenoidectomy    . D [place]    . Dilation and curettage of uterus      FAMILY HISTORY: Family History  Problem Relation Age of Onset  . Arthritis Mother   . Hyperlipidemia Mother   .  Hypertension Mother   . Dementia Mother   . Arthritis Father   . Prostate cancer Father   . Hypertension Father   . Arthritis Maternal Grandmother   . Hypertension Maternal Grandmother   . Arthritis Maternal Grandfather   . Hypertension Maternal Grandfather   . Arthritis Paternal Grandmother   . Hypertension Paternal Grandmother   . Arthritis Paternal Grandfather   . Hypertension Paternal Grandfather   . Diabetes Paternal Grandfather   . Heart disease Sister     congenital heart disease  . Stroke Sister   . Dementia Sister   . Cancer - Colon Brother   . Stroke Brother   . Pulmonary embolism Brother   . Stroke Brother   . Gout Brother     SOCIAL HISTORY: History   Social History  . Marital Status: Divorced  Spouse Name: N/A  . Number of Children: 4  . Years of Education: college 1   Occupational History  . FLOATER    Social History Main Topics  . Smoking status: Never Smoker   . Smokeless tobacco: Never Used  . Alcohol Use: Yes     Comment: occasionally  . Drug Use: No  . Sexual Activity: Not on file   Other Topics Concern  . Not on file   Social History Narrative  . No narrative on file      PHYSICAL EXAM  Filed Vitals:   12/26/14 0826  BP: 165/99  Pulse: 63  Height: 5\' 4"  (1.626 m)  Weight: 168 lb (76.204 kg)   Body mass index is 28.82 kg/(m^2).  Generalized: Well developed, in no acute distress   Neurological examination  Mentation: Alert oriented to time, place, history taking. Follows all commands speech and language fluent. MMSE 29/30 Cranial nerve II-XII: Pupils were equal round reactive to light. Extraocular movements were full, visual field were full on confrontational test. Facial sensation and strength were normal. Uvula tongue midline. Head turning and shoulder shrug  were normal and symmetric. Motor: The motor testing reveals 5 over 5 strength of all 4 extremities. Good symmetric motor tone is noted throughout.  Sensory: Sensory  testing is intact to soft touch on all 4 extremities. No evidence of extinction is noted.  Coordination: Cerebellar testing reveals good finger-nose-finger and heel-to-shin bilaterally.  Gait and station: Gait is normal. Tandem gait is normal. Romberg is negative. No drift is seen.  Reflexes: Deep tendon reflexes are symmetric and normal bilaterally.    DIAGNOSTIC DATA (LABS, IMAGING, TESTING) - I reviewed patient records, labs, notes, testing and imaging myself where available.      ASSESSMENT AND PLAN 69 y.o. year old female  has a past medical history of Arthritis; Chicken pox; Depression; Positive TB test; Blood transfusion; Family history of thyroid problem; HTN (hypertension); Memory deficits (12/16/2013); Irritable bowel syndrome; and Degenerative arthritis. here with:  1.memory loss   The patient's memory score has remained the same. Her MMSE is 29 out of 30. I have advised the patient to keep her appointment with her therapist. As of now medication for her memory is not warranted. If her symptoms worsen or she develops new symptoms she should let us know. Otherwise she will follow-up in 6 months or sooner if needed.    Ward Givens, MSN, NP-C 12/26/2014, 8:24 AM [facility] [address] [place], [place] [address] [phone]  Note: This document was prepared with digital dictation and possible smart phrase technology. Any transcriptional errors that result from this process are unintentional.

## 2014-12-26 NOTE — Patient Instructions (Signed)
Memory score is stable.  Keep appt with therapist.  If symptoms worsen please let us know.

## 2014-12-26 NOTE — Progress Notes (Signed)
I have read the note, and I agree with the clinical assessment and plan.  WILLIS,CHARLES KEITH   

## 2014-12-28 ENCOUNTER — Encounter (HOSPITAL_COMMUNITY): Payer: Self-pay | Admitting: Clinical

## 2014-12-28 ENCOUNTER — Encounter (HOSPITAL_COMMUNITY): Payer: Self-pay

## 2014-12-28 ENCOUNTER — Ambulatory Visit (INDEPENDENT_AMBULATORY_CARE_PROVIDER_SITE_OTHER): Payer: Medicare Other | Admitting: Clinical

## 2014-12-28 DIAGNOSIS — F4310 Post-traumatic stress disorder, unspecified: Secondary | ICD-10-CM

## 2014-12-28 DIAGNOSIS — F331 Major depressive disorder, recurrent, moderate: Secondary | ICD-10-CM | POA: Diagnosis not present
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
## 2015-01-05 ENCOUNTER — Ambulatory Visit (INDEPENDENT_AMBULATORY_CARE_PROVIDER_SITE_OTHER): Payer: Medicare Other | Admitting: Clinical

## 2015-01-05 DIAGNOSIS — F4310 Post-traumatic stress disorder, unspecified: Secondary | ICD-10-CM | POA: Diagnosis not present

## 2015-01-05 DIAGNOSIS — F331 Major depressive disorder, recurrent, moderate: Secondary | ICD-10-CM | POA: Diagnosis not present

## 2015-01-05 NOTE — Progress Notes (Signed)
   THERAPIST PROGRESS NOTE  Session Time: 6:55 - 8:00  Participation Level: Active  Behavioral Response: Casual and NeatAlertDepressed  Type of Therapy: Individual Therapy  Treatment Goals addressed: Improve psychiatric symptoms, Improve faulty thinking patterns  Interventions: CBT, Motivational Interviewing, grounding and mindfulness techniques  Summary: Christina [name] is a 69 y.o. female who presents with Major depressive disorder, recurrent episode, moderate and PTSD (post-traumatic stress disorder).  Suicidal/Homicidal: Nowithout intent/plan  Therapist Response: Christina [name] met with clinician for an individual session. Christina [name] discussed her psychiatric symptoms and her current life events. Christina [name] was accompanied by her friend, the friend was there for support and to observe. Christina [name] shared that her work was very stressful because of management issues as well as some of the staff dynamics. Christina [name] shared that she has been depressed and anxious. Clinician introduced grounding techniques. Clinician discussed the purpose and process. Client and clinician practiced some of the techniques together. Christina [name] shared some of her thoughts about changing her thought process at this age. Client and clinician discussed how changing and improving might affect her. Christina [name] agreed to practice daily until next session.  Plan: Return again in 1 weeks.  Diagnosis: Axis I: Major depressive disorder, recurrent episode, moderate and PTSD (post-traumatic stress disorder).        Taelor Waymire A, LCSW 01/05/2015

## 2015-01-07 ENCOUNTER — Encounter (HOSPITAL_COMMUNITY): Payer: Self-pay | Admitting: Clinical

## 2015-01-12 ENCOUNTER — Encounter (HOSPITAL_COMMUNITY): Payer: Self-pay | Admitting: Clinical

## 2015-01-12 ENCOUNTER — Ambulatory Visit (INDEPENDENT_AMBULATORY_CARE_PROVIDER_SITE_OTHER): Payer: Medicare Other | Admitting: Clinical

## 2015-01-12 DIAGNOSIS — F331 Major depressive disorder, recurrent, moderate: Secondary | ICD-10-CM | POA: Diagnosis not present

## 2015-01-12 DIAGNOSIS — F4310 Post-traumatic stress disorder, unspecified: Secondary | ICD-10-CM | POA: Diagnosis not present

## 2015-01-12 NOTE — Progress Notes (Signed)
   THERAPIST PROGRESS NOTE  Session Time: 7:00 -7:56  Participation Level: Active  Behavioral Response: CasualAlertNA  Type of Therapy: Individual Therapy  Treatment Goals addressed: Improve psychiatric symptoms, Improve faulty thinking patterns, emotional regulation skills  Interventions: CBT, Motivational Interviewing, grounding and mindfulness techniques  Summary: Christina [name] is a 69 y.o. female who presents with Major depressive disorder, recurrent episode, moderate and PTSD (post-traumatic stress disorder).  Suicidal/Homicidal: No - without intent/plan  Therapist Response: Christina [name] met with clinician for an individual session. She was accompanied by her friend who was there to support her. [name] discussed her psychiatric symptoms, her current life events and her homework. [name] shared about some of her stresses at work. [name] stated that she did not remember what she talked about last session. [name] shared that she had been practicing the 10x3 grounding technique but had not tried any of the other ones.Clinician introduced some cbt concepts. Client and clinician worked a 7 panel thought record sheet on an event that happened at work. Through the process client and clinician discussed how our thoughts inform our emotions, perceptions, and actions. Christina [name] and clinician agreed to continue the discussion further at future sessions. [name] agreed to complete grounding/mindfulness and cbt homework until next session.  Plan: Return again in 1 weeks.  Diagnosis:     Axis I: Major depressive disorder, recurrent episode, moderate and PTSD (post-traumatic stress disorder).    Lael Pilch A, LCSW 01/12/2015

## 2015-01-19 ENCOUNTER — Ambulatory Visit (INDEPENDENT_AMBULATORY_CARE_PROVIDER_SITE_OTHER): Payer: Medicare Other | Admitting: Clinical

## 2015-01-19 ENCOUNTER — Encounter (HOSPITAL_COMMUNITY): Payer: Self-pay | Admitting: Clinical

## 2015-01-19 DIAGNOSIS — F4310 Post-traumatic stress disorder, unspecified: Secondary | ICD-10-CM | POA: Diagnosis not present

## 2015-01-19 DIAGNOSIS — F331 Major depressive disorder, recurrent, moderate: Secondary | ICD-10-CM | POA: Diagnosis not present

## 2015-01-19 NOTE — Progress Notes (Signed)
   THERAPIST PROGRESS NOTE  Session Time: 7:00 - 8:02  Participation Level: Active  Behavioral Response: CasualAlertDepressed  Type of Therapy: Individual Therapy  Treatment Goals addressed: Improve psychiatric symptoms, Improve faulty thinking patterns, emotional regulation skills, stress reduction techniques  Interventions: CBT, Motivational Interviewing, grounding and mindfulness techniques  Summary: Christina [name] is a 69 y.o. female who presents with Major depressive disorder, recurrent episode, moderate and PTSD (post-traumatic stress disorder).  Suicidal/Homicidal: No - without intent/plan  Therapist Response: [name] met with clinician for an individual session. [name] was accompanied by her friend [name], for support.  [name] discussed her psychiatric symptoms, her current life events and her homework. [name] and clinician reviewed and discussed her homework. [name] and clinician discussed her grounding/mindfulness practice.  [name] reports that she has been practicing regularly and sometimes notices a difference and sometimes does not. Client and clinician discussed the techniques and the differing situations.Sawsan shared her thoughts and insights about her cbt homework.  [name] shared her depression symptom check list. [name] shared that she never allows herself to experience emotions, she also shared a  recurring thought she has at work "I should leave (or avoid). [name] and clinician used a cbt worksheet to examine the thought. [name] shared more in depth as  Client and clinician examined the evidence. [name] seemed to be resistant to the idea that she had the power to change how she experiences situations. [name] made such statements as "It is always like that." Client and clinician discussed her homework where she had pointed out that her negative predictions were most often wrong. [name] and clinician discussed the process of changing thoughts and how and why resistance works in all of [place]. [name] and clinician  discussed the difficulty of challenging ones core beliefs. Clinician supported [name] in taking the time to process the information and to continue to do her homework.  Plan: Return again in 1 weeks.  Diagnosis:     Axis I: Major depressive disorder, recurrent episode, moderate and PTSD (post-traumatic stress disorder).    Dezi Brauner A, LCSW 01/19/2015

## 2015-01-26 ENCOUNTER — Ambulatory Visit (INDEPENDENT_AMBULATORY_CARE_PROVIDER_SITE_OTHER): Payer: Medicare Other | Admitting: Clinical

## 2015-01-26 ENCOUNTER — Encounter (HOSPITAL_COMMUNITY): Payer: Self-pay | Admitting: Clinical

## 2015-01-26 DIAGNOSIS — F4310 Post-traumatic stress disorder, unspecified: Secondary | ICD-10-CM | POA: Diagnosis not present

## 2015-01-26 DIAGNOSIS — F331 Major depressive disorder, recurrent, moderate: Secondary | ICD-10-CM | POA: Diagnosis not present

## 2015-01-26 NOTE — Progress Notes (Signed)
   THERAPIST PROGRESS NOTE  Session Time: 7:02 -8:02  Participation Level: Active  Behavioral Response: CasualAlertDepressed  Type of Therapy: Individual Therapy  Treatment Goals addressed: Improve psychiatric symptoms, Improve faulty thinking patterns,   Interventions: CBT, Motivational Interviewing,   Summary: Christina [name] is a 70 y.o. female who presents with Major depressive disorder, recurrent episode, moderate and PTSD (post-traumatic stress disorder).  Suicidal/Homicidal: No - without intent/plan  Therapist Response: Christina [name] met with clinician for an individual session. [name] was accompanied by her friend Christina [name], for support.  [name] discussed her psychiatric symptoms, her current life events and her homework. [name] shared that she believed her memory was worse this past week. Client and clinician discussed what she noticed and what she thought the cause might be. [name] shared that she had completed her homework. She shared her thoughts and insights about her homework. She shared that she continues to benefit from practicing her grounding techniques. She shared her cbt homework. [name] shared a core belief about her not belonging or being connected to others. Client and clinician discussed how she felt when she thought the thought. [name] shared that it made her feel sad and distant. Christina [name] and clinician worked a cbt work sheet on he belief. [name] was able to identify evidence that did and did not support her belief. Christina [name] and clinician discussed the cost and benefit of challenging and changing core beliefs. [name] was not yet ready to change the belief. She shared that she had it since her son died. Client and clinician agreed to continue the discussion at a future date.  Plan: Return again in 1 weeks.  Diagnosis:     Axis I: Major depressive disorder, recurrent episode, moderate and PTSD (post-traumatic stress disorder).   [name] A, LCSW 01/26/2015

## 2015-02-02 ENCOUNTER — Ambulatory Visit (INDEPENDENT_AMBULATORY_CARE_PROVIDER_SITE_OTHER): Payer: Medicare Other | Admitting: Clinical

## 2015-02-02 ENCOUNTER — Encounter (HOSPITAL_COMMUNITY): Payer: Self-pay | Admitting: Clinical

## 2015-02-02 DIAGNOSIS — F331 Major depressive disorder, recurrent, moderate: Secondary | ICD-10-CM

## 2015-02-02 DIAGNOSIS — F4310 Post-traumatic stress disorder, unspecified: Secondary | ICD-10-CM | POA: Diagnosis not present

## 2015-02-02 NOTE — Progress Notes (Signed)
   THERAPIST PROGRESS NOTE  Session Time: 7:00 -7:58  Participation Level: Active  Behavioral Response: CasualAlerthappy and depressed (off and on) depending on what she was working on  Type of Therapy: Individual Therapy  Treatment Goals addressed: Improve psychiatric symptoms, Improve faulty thinking patterns, emotional regulation skills,  Interventions: CBT, Motivational Interviewing, grounding and mindfulness techniques  Summary: Christina [name] is a 69 y.o. female who presents with Major depressive disorder, recurrent episode, moderate and PTSD (post-traumatic stress disorder).  Suicidal/Homicidal: No - without intent/plan  Therapist Response: [name] met with clinician for an individual session. Christina [name] was accompanied by her friend Christina [name], for support.  Christina [name] discussed her psychiatric symptoms, her current life events and her homework  Christina [name] shared that she had a good and bad week. She shared that she has gone dancing 2 times (though she doesn't dance much it challenges her to socialize. She also shared her disappointment because her granddaughter behaved inappropriately when they were out and about. [name] and clinician used a 7 panel worksheet to process her negative thoughts about the event. Christina [name] first identified and rated her emotions. She  Then identified her negative thoughts. Her negative thoughts were related to responsibility and  Rejection by others. Christina [name] was then able to identify the evidence that did and did not support her automatic negative thoughts (and core beliefs). [name] and clinician discussed the difference between personal truths and universal truth. Client and clinician agreed to continue the discussion at a future date.  [name] and clinician reviewed and discussed her homework and [name] agreed to continue her homework until next session.  Plan: Return again in 1 weeks.  Diagnosis:     Axis I: Major depressive disorder, recurrent episode, moderate and PTSD (post-traumatic stress  disorder).    Christina [name] A, LCSW 02/02/2015

## 2015-02-09 ENCOUNTER — Ambulatory Visit (INDEPENDENT_AMBULATORY_CARE_PROVIDER_SITE_OTHER): Payer: Medicare Other | Admitting: Clinical

## 2015-02-09 ENCOUNTER — Encounter (HOSPITAL_COMMUNITY): Payer: Self-pay | Admitting: Clinical

## 2015-02-09 DIAGNOSIS — F331 Major depressive disorder, recurrent, moderate: Secondary | ICD-10-CM

## 2015-02-09 DIAGNOSIS — F4310 Post-traumatic stress disorder, unspecified: Secondary | ICD-10-CM | POA: Diagnosis not present

## 2015-02-09 NOTE — Progress Notes (Signed)
   THERAPIST PROGRESS NOTE  Session Time: 7:01 -8:00  Participation Level: Active  Behavioral Response: CasualAlertDepressed  Type of Therapy: Individual Therapy  Treatment Goals addressed: Improve psychiatric symptoms, Improve faulty thinking patterns, emotional regulation skills, stress reduction techniques  Interventions: CBT, Motivational Interviewing, grounding and mindfulness techniques  Summary: Christina [name] is a 69 y.o. female who presents with Major depressive disorder, recurrent episode, moderate and PTSD (post-traumatic stress disorder).  Suicidal/Homicidal: No - without intent/plan  Therapist Response: [name] met with clinician for an individual session. [name] was accompanied by her friend [name], for support.  [name] discussed her psychiatric symptoms, her current life events and her homework. [name] reported she had completed all of her homework. [name] and clinician discussed her grounding/mindfulness techniques. [name] reported about her practice and what she has found most effect. [name] and clinician reviewed her cbt homework. [name] had the insight that while one of her core beliefs is that she is unable to connect to others the evidence is showing this not to be true. [name] shared 3 different ways she had connected with others. [name]  And clinician discussed how you can create connection with others even when you are not the main talker. [name] often connects with others through listening. Her feelings of disconnection come from the belief that she does not converse well. Client and clinician discussed this. [name] and clinician also reviewed her other homework. [name] shared her gratitude journal. Client and clinician discussed how she could get a more therapeutic value out of the practice. [name] agreed to continue her homework until next session.  Plan: Return again in 1 weeks.  Diagnosis:     Axis I: Major depressive disorder, recurrent episode, moderate and PTSD (post-traumatic stress  disorder).    Kadesha Virrueta A, LCSW 02/09/2015

## 2015-02-16 ENCOUNTER — Encounter (HOSPITAL_COMMUNITY): Payer: Self-pay | Admitting: Clinical

## 2015-02-16 ENCOUNTER — Ambulatory Visit (INDEPENDENT_AMBULATORY_CARE_PROVIDER_SITE_OTHER): Payer: Medicare Other | Admitting: Clinical

## 2015-02-16 DIAGNOSIS — F331 Major depressive disorder, recurrent, moderate: Secondary | ICD-10-CM | POA: Diagnosis not present

## 2015-02-16 DIAGNOSIS — F4310 Post-traumatic stress disorder, unspecified: Secondary | ICD-10-CM

## 2015-02-16 NOTE — Progress Notes (Signed)
   THERAPIST PROGRESS NOTE  Session Time: 7:00 - 8:02  Participation Level: Active  Behavioral Response: CasualAlertDepressed  Type of Therapy: Individual Therapy  Treatment Goals addressed: Improve psychiatric symptoms, Improve faulty thinking patterns, emotional regulation skills,   Interventions: CBT, Motivational Interviewing  Summary: Christina [name] is a 69 y.o. female who presents with Major depressive disorder, recurrent episode, moderate and PTSD (post-traumatic stress disorder).  Suicidal/Homicidal: No - without intent/plan  Therapist Response: Christina [name] met with clinician for an individual session. Christina [name] was accompanied by her friend Christina [name], for support.  Christina [name] discussed her psychiatric symptoms, her current life events and her homework. Christina [name] reported that she is seeing some small improvements in her psychiatric symptoms. She reported that she is finding it easier to write in her gratitude journal. She shared that she continues to do her grounding techniques. Christina [name] and clinician reviewed and discussed her homework packet on Depression. The packet described unhelpful thinking styles.Christina [name] and clinician discussed the styles that she uses most often and how they affect her perception. Christina [name] and clinician discussed how to recognize and challenge those thinking styles. Christina [name] and clinician also discussed one of her negative core beliefs. Christina [name] and clinician discussed how the unhelpful thinking styles help her to maintain her negative core belief.  Christina [name] agreed to continue her homework until next session. - grounding techniques, gratitude journal, 1 - 7 panel cbt worksheet, cbt packet on depression  Plan: Return again in 1 weeks.  Diagnosis:     Axis I: Major depressive disorder, recurrent episode, moderate and PTSD (post-traumatic stress disorder).   Risa Auman A, LCSW 02/16/2015

## 2015-02-23 ENCOUNTER — Encounter (HOSPITAL_COMMUNITY): Payer: Self-pay | Admitting: Clinical

## 2015-02-23 ENCOUNTER — Ambulatory Visit (INDEPENDENT_AMBULATORY_CARE_PROVIDER_SITE_OTHER): Payer: Medicare Other | Admitting: Clinical

## 2015-02-23 DIAGNOSIS — F4310 Post-traumatic stress disorder, unspecified: Secondary | ICD-10-CM | POA: Diagnosis not present

## 2015-02-23 DIAGNOSIS — F331 Major depressive disorder, recurrent, moderate: Secondary | ICD-10-CM | POA: Diagnosis not present

## 2015-02-23 NOTE — Progress Notes (Addendum)
   THERAPIST PROGRESS NOTE  Session Time: 7:00 -7:57  Participation Level: Active  Behavioral Response: CasualAlertNA  Type of Therapy: Individual Therapy  Treatment Goals addressed: Improve psychiatric symptoms, Improve faulty thinking patterns,  stress reduction techniques  Interventions: CBT and Motivational Interviewing,  Grounding and mindfulness techniques (stress reduction)  Summary: Haydon Kalmar is a 69 y.o. female who presents with Major depressive disorder, recurrent episode, moderate and PTSD (post-traumatic stress disorder).  Suicidal/Homicidal: No - without intent/plan  Therapist Response: [name] met with clinician for an individual session. Omni was accompanied by her friend [name], for support.  Samreen discussed her psychiatric symptoms, her current life events and her homework. Zahira reported that she had some good days and some bad days this week. She reported that she felt her memory was better this past week. 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 She shared that this caused her to have a lot of negative predictions about how work would be which in turn made her feel depressed. Client and clinician discussed her negative automatic thoughts.  After sharing them she recognized them to be catastrophic and black &white thinking styles. Client and clinician discussed her negative automatic thoughts, and the evidence that did and did not support the thought. Janiah was able to formulate healthier alternative thoughts. [name] and clinician discussed how her behaviors might vary at work depending on the thoughts she had. [name] and clinician also discussed stress reduction techniques.Krishauna shared about the stress reduction techniques she had used the past week and whether or not they were effective. [name] and clinician discussed how she could improve her practice. Client and clinician reviewed and discussed her homework packet. Milana agreed to  continue her homework until next session - packet, grounding and mindfulness, and gratitude journal. Hadlyn shared that she recognizes some positive changes in herself with regard to mood and willingness to go outside her comfort zone.   Plan: Return again in 1 weeks.  Diagnosis:     Axis I: Major depressive disorder, recurrent episode, moderate and PTSD (post-traumatic stress disorder).    Chevette Fee A, LCSW 02/23/2015

## 2015-03-02 ENCOUNTER — Encounter (HOSPITAL_COMMUNITY): Payer: Self-pay | Admitting: Clinical

## 2015-03-02 ENCOUNTER — Ambulatory Visit (INDEPENDENT_AMBULATORY_CARE_PROVIDER_SITE_OTHER): Payer: Medicare Other | Admitting: Clinical

## 2015-03-02 DIAGNOSIS — F331 Major depressive disorder, recurrent, moderate: Secondary | ICD-10-CM

## 2015-03-02 DIAGNOSIS — F4310 Post-traumatic stress disorder, unspecified: Secondary | ICD-10-CM

## 2015-03-02 NOTE — Progress Notes (Signed)
   THERAPIST PROGRESS NOTE  Session Time: 7:01 - 8:02  Participation Level: Active  Behavioral Response: CasualAlertNA  Type of Therapy: Individual Therapy  Treatment Goals addressed: Improve psychiatric symptoms, Improve faulty thinking patterns, stress reduction techniques  Interventions: CBT, Motivational Interviewing, grounding and mindfulness techniques  Summary: Christina [name] is a 69 y.o. female who presents with Major depressive disorder, recurrent episode, moderate and PTSD (post-traumatic stress disorder).  Suicidal/Homicidal: No - without intent/plan  Therapist Response: Christina [name] met with [name] for an individual session. Christina [name] was accompanied by her friend Christina [name], for support.  Christina [name] discussed her psychiatric symptoms, her current life events and her homework. Christina [name] shared that it had been a very stressful week at work. She shared that the female staff does not value the female staff, that the machines were not saving the work, and that she had to work late hours. Christina [name] discussed what was in her power to change (her own behaviors and attitudes) and what was not. Christina [name] shared that she is noticing that she feels better when she focuses on the patient and her work rather than other staff. Christina [name] discussed the skills she is using to do so. Christina [name] and [name] discussed and reviewed her homework. Christina [name] discussed the stress reduction techniques she has been using. She shared that she continues to do mindfulness and grounding daily. She shared what works and Christina [name], Christina [name] discussed improvements for what doesn't work. Christina [name] and [name] reviewed and discussed her cbt homework on depression. Christina [name] shared her thoughts and insights. Christina [name] asked questions and [name] answered. Christina [name] is more able to identify her negative automatic thoughts though she still has trouble formulating healthier alternative thoughts. Christina [name] had completed a behavioral experiment. She  had challenged herself to interact with others in a social setting. She reported that she had expected others to not like her but instead others responded positively to her efforts to be friendly. Christina [name] reports she continues to keep a gratitude journal and is finding it easier to find positive things through out the day which in turn has improved her mood slightly. Christina [name] agreed to continue her homework until next session.  Plan: Return again in 1 weeks.  Diagnosis:     Axis I: Major depressive disorder, recurrent episode, moderate and PTSD (post-traumatic stress disorder).   Ashden Sonnenberg A, LCSW 03/02/2015

## 2015-03-09 ENCOUNTER — Ambulatory Visit (INDEPENDENT_AMBULATORY_CARE_PROVIDER_SITE_OTHER): Payer: Medicare Other | Admitting: Clinical

## 2015-03-09 ENCOUNTER — Encounter (HOSPITAL_COMMUNITY): Payer: Self-pay | Admitting: Clinical

## 2015-03-09 DIAGNOSIS — F4310 Post-traumatic stress disorder, unspecified: Secondary | ICD-10-CM

## 2015-03-09 DIAGNOSIS — F331 Major depressive disorder, recurrent, moderate: Secondary | ICD-10-CM | POA: Diagnosis not present

## 2015-03-09 NOTE — Progress Notes (Signed)
   THERAPIST PROGRESS NOTE  Session Time: 7:02  -8:02  Participation Level: Active  Behavioral Response: CasualAlertNA  Type of Therapy: Individual Therapy  Treatment Goals addressed: Improve psychiatric symptoms, Improve faulty thinking patterns, emotional regulation skills, stress reduction techniques  Interventions: CBT, Motivational Interviewing, grounding and mindfulness techniques  Summary: Christina [name] is [name] 69 y.o. female who presents with Major depressive disorder, recurrent episode, moderate and PTSD (post-traumatic stress disorder).  Suicidal/Homicidal: No - without intent/plan  Therapist Response: Christina [name] met with clinician for an individual session. Christina [name] was accompanied by her friend Christina [name], for support.  Saavi discussed her psychiatric symptoms, her current life events and her homework. Christina [name] shared that she had been noticing that she "spaces out" sometimes when having conflict with another. Christina [name] and her friend Christina [name] shared examples. Christina [name] and clinician discussed how this affects Christina [name]. Christina [name] shared that she is then unable to remember what is dicussed. Christina [name] and clinician discussed her thoughts and physical sensations when this happens. Christina [name] and clinician discussed how she could use her grounding and mindfulness techniques to help her interrupt the cycle. Clinician gave examples and client and clinician practiced [name] technique together. Client and clinician reviewed her cbt worksheets. Christina [name] shared her thoughts and insights. Christina [name] shared examples of how the information applied to her. Client and clinician discussed how her skills were improving. Christina [name] shared she was happy to see some results.  Plan: Return again in 1 weeks.  Diagnosis:     Axis I: Major depressive disorder, recurrent episode, moderate and PTSD (post-traumatic stress disorder).   Christina Bai A, LCSW 03/09/2015

## 2015-03-16 ENCOUNTER — Encounter (HOSPITAL_COMMUNITY): Payer: Self-pay | Admitting: Clinical

## 2015-03-16 ENCOUNTER — Ambulatory Visit (INDEPENDENT_AMBULATORY_CARE_PROVIDER_SITE_OTHER): Payer: Medicare Other | Admitting: Clinical

## 2015-03-16 DIAGNOSIS — F4310 Post-traumatic stress disorder, unspecified: Secondary | ICD-10-CM | POA: Diagnosis not present

## 2015-03-16 DIAGNOSIS — F331 Major depressive disorder, recurrent, moderate: Secondary | ICD-10-CM

## 2015-03-16 NOTE — Progress Notes (Signed)
   THERAPIST PROGRESS NOTE  Session Time: 7:01 - 7:59  Participation Level: Active  Behavioral Response: CasualAlertDepressed  Type of Therapy: Individual Therapy  Treatment Goals addressed: Improve psychiatric symptoms, Improve faulty thinking patterns, emotional regulation skills, stress reduction techniques  Interventions: CBT, Motivational Interviewing, grounding and mindfulness techniques  Summary: Khamya Topp is a 69 y.o. female who presents with Major depressive disorder, recurrent episode, moderate and PTSD (post-traumatic stress disorder).  Suicidal/Homicidal: No - without intent/plan  Therapist Response: [name] met with clinician for an individual session. Roena was accompanied by her friend [name], for support.  Jeri discussed her psychiatric symptoms, her current life events and her homework. Tari shared that things got crazy at work and she was not able to complete all of her homework. Jamariyah shared about her work week and the ways that she had applied her skills to her work. Mavis shared that they had helped when she was with clients but that dealing with staff was more challenging. [name] and clinician discussed her after work routine. Amanada shared what she did t release stress when she returned home. She had the insight that reading may also help her with her stress level. Ozell shared about going to the dance and working on having better eye contact and conversation with others. Cynthya shared that while it is challenging others are responding more positively to her. She shared about her efforts to challenge her negative automatic thoughts. [name] and clinician discussed what was working and what is not working. Client and clinician discussed changes that might be helpful, She shared about using her grounding and mindfulness techniques in times of anxiety. Client and clinician discussed ways to continue to improve her techniques. Sheronda agreed to continue her homework until next session.  Plan:  Return again in 1 weeks.  Diagnosis:     Axis I: Major depressive disorder, recurrent episode, moderate and PTSD (post-traumatic stress disorder).   Daine Croker A, LCSW 03/16/2015

## 2015-03-23 ENCOUNTER — Ambulatory Visit (HOSPITAL_COMMUNITY): Payer: Self-pay | Admitting: Clinical

## 2015-03-30 ENCOUNTER — Ambulatory Visit (INDEPENDENT_AMBULATORY_CARE_PROVIDER_SITE_OTHER): Payer: 59 | Admitting: Clinical

## 2015-03-30 ENCOUNTER — Encounter (HOSPITAL_COMMUNITY): Payer: Self-pay | Admitting: Clinical

## 2015-03-30 DIAGNOSIS — F4310 Post-traumatic stress disorder, unspecified: Secondary | ICD-10-CM | POA: Diagnosis not present

## 2015-03-30 DIAGNOSIS — F331 Major depressive disorder, recurrent, moderate: Secondary | ICD-10-CM

## 2015-03-30 NOTE — Progress Notes (Signed)
   THERAPIST PROGRESS NOTE  Session Time: 7:02 - 8:00  Participation Level: Active  Behavioral Response: CasualAlertDepressed  Type of Therapy: Individual Therapy  Treatment Goals addressed: Improve psychiatric symptoms, Improve faulty thinking patterns, stress reduction techniques  Interventions: CBT, Motivational Interviewing,   Summary: Christina [name] is a 69 y.o. female who presents with Major depressive disorder, recurrent episode, moderate and PTSD (post-traumatic stress disorder).  Suicidal/Homicidal: No - without intent/plan  Therapist Response: Christina [name] met with clinician for an individual session. Christina [name] was accompanied by her friend Christina [name], for support.  Christina [name] discussed her psychiatric symptoms, her current life events and her homework. Christina [name] shared that she did not complete all of her homework. She shared that since there have been changes at her work she has been working a lot more. Christina [name] shared that she does continue to practice her grounding and mindfulness techniques and continues to find them useful. Christina [name] shared that she has been putting her cbt and emotional regulation techniques to use when she is out. She shared that she is more likely to interact with others. Christina [name] shared that she does not believe she knows what it feels like to be happy. She shared that she has been feeling a bit less depressed but she does not feel happy. Client and clinician discussed what Christina [name] thought about happiness. Christina [name] shared that others seemed to get more pleasure out of things than she does. Client and clinician discussed what actions she could take to bring her more pleasure. Christina [name] shared that she was taking a week stay at home vacation from work. Client and clinician discussed what she could do to care for herself and bring her pleasure. Christina [name] shared that she might need to do something out of routine as well as some gardening which reduces her stress. Christina [name] agreed to do her homework prior to next session. Christina [name]  shared she would also continue to practice her techniques and to report back on her progress at next session. Client and clinician agreed to meet in two weeks.   Plan: Return again in 2 weeks.  Diagnosis:     Axis I: Major depressive disorder, recurrent episode, moderate and PTSD (post-traumatic stress disorder).    Jaquana Geiger A, LCSW 03/30/2015

## 2015-04-04 ENCOUNTER — Other Ambulatory Visit: Payer: Self-pay | Admitting: Physician Assistant

## 2015-04-04 DIAGNOSIS — R609 Edema, unspecified: Secondary | ICD-10-CM

## 2015-04-04 DIAGNOSIS — M79632 Pain in left forearm: Secondary | ICD-10-CM

## 2015-04-04 DIAGNOSIS — M79602 Pain in left arm: Secondary | ICD-10-CM

## 2015-04-06 ENCOUNTER — Other Ambulatory Visit: Payer: Self-pay

## 2015-04-07 ENCOUNTER — Ambulatory Visit
Admission: RE | Admit: 2015-04-07 | Discharge: 2015-04-07 | Disposition: A | Discharge status: Home / Self Care | Payer: Worker's Compensation | Source: Ambulatory Visit | Attending: Physician Assistant | Admitting: Physician Assistant

## 2015-04-07 DIAGNOSIS — M79602 Pain in left arm: Secondary | ICD-10-CM

## 2015-04-07 DIAGNOSIS — R609 Edema, unspecified: Secondary | ICD-10-CM

## 2015-04-07 DIAGNOSIS — M79632 Pain in left forearm: Secondary | ICD-10-CM

## 2015-04-13 ENCOUNTER — Encounter (HOSPITAL_COMMUNITY): Payer: Self-pay | Admitting: Clinical

## 2015-04-13 ENCOUNTER — Ambulatory Visit (INDEPENDENT_AMBULATORY_CARE_PROVIDER_SITE_OTHER): Payer: 59 | Admitting: Clinical

## 2015-04-13 DIAGNOSIS — F331 Major depressive disorder, recurrent, moderate: Secondary | ICD-10-CM | POA: Diagnosis not present

## 2015-04-13 DIAGNOSIS — F4310 Post-traumatic stress disorder, unspecified: Secondary | ICD-10-CM | POA: Diagnosis not present

## 2015-04-13 NOTE — Progress Notes (Signed)
   THERAPIST PROGRESS NOTE  Session Time: 7:02 -7:58  Participation Level: Active  Behavioral Response: CasualAlertDepressed  Type of Therapy: Individual Therapy  Treatment Goals addressed: Improve psychiatric symptoms, Improve faulty thinking patterns, emotional regulation skills, stress reduction techniques  Interventions: CBT, Motivational Interviewing,  Summary: Christina [name] is a 69 y.o. female who presents with Major depressive disorder, recurrent episode, moderate and PTSD (post-traumatic stress disorder).  Suicidal/Homicidal: No - without intent/plan  Therapist Response: Christina [name] met with clinician for an individual session. Christina [name] was accompanied by her friend Christina [name], for support.  [name] discussed her psychiatric symptoms, her current life events and her homework. [name] shared that her mood has been a little better. She reported that she stayed at home for vacation and was able to relax and catch up on things. She shared that unfortunately prior to vacation she hurt her arm at work due to a file cabinet being behind a door. She shared that she had completed her homework. She reports that she has been working on eye contact and smiling at others (talked about in a prior session. She shared that others are responding positively which makes her feel happier. She shared that she has been trying to use her cbt skills to question her negative automatic thoughts which also helps. Christina [name] and clinician discussed her process and her insights.Christina [name] shared about some of the places she still struggles and client and clinician discussed alternative healthier ways to approach those situations. Christina [name] was to work in her garden for stress reduction but was unable to because of her arm. Client and clinician discussed the techniques she did use to reduce her stress. Christina [name] and clinician agreed to meet again in 2 weeks. Christina [name] agreed to continue her homework until then.   Plan: Return again in 2 weeks.  Diagnosis:      Axis I: Major depressive disorder, recurrent episode, moderate and PTSD (post-traumatic stress disorder).   Hester Joslin A, LCSW 04/13/2015

## 2015-04-20 ENCOUNTER — Ambulatory Visit (HOSPITAL_COMMUNITY): Payer: Self-pay | Admitting: Clinical

## 2015-04-27 ENCOUNTER — Encounter (HOSPITAL_COMMUNITY): Payer: Self-pay | Admitting: Clinical

## 2015-04-27 ENCOUNTER — Ambulatory Visit (INDEPENDENT_AMBULATORY_CARE_PROVIDER_SITE_OTHER): Payer: 59 | Admitting: Clinical

## 2015-04-27 DIAGNOSIS — F4310 Post-traumatic stress disorder, unspecified: Secondary | ICD-10-CM | POA: Diagnosis not present

## 2015-04-27 DIAGNOSIS — F331 Major depressive disorder, recurrent, moderate: Secondary | ICD-10-CM | POA: Diagnosis not present

## 2015-04-27 NOTE — Progress Notes (Signed)
   THERAPIST PROGRESS NOTE  Session Time: 7:03 -8:00  Participation Level: Active  Behavioral Response: CasualAlertNA  Type of Therapy: Individual Therapy  Treatment Goals addressed: Improve psychiatric symptoms, Improve faulty thinking patterns, emotional regulation skills,  Interventions: CBT, Motivational Interviewing, grounding and mindfulness techniques  Summary: Christina [name] is a 69 y.o. female who presents with Major depressive disorder, recurrent episode, moderate and PTSD (post-traumatic stress disorder).  Suicidal/Homicidal: No - without intent/plan  Therapist Response: Christina [name] met with clinician for an individual session. Christina [name] was accompanied by her friend [name], for support.  Kayda discussed her psychiatric symptoms, her current life events and her homework. Christina [name] and clinician discussed her homework. Part of her homework included a behavioral experiment. Christina [name]  Shared that she had challenged a belief several times in the past two weeks. The belief has been with her since childhood and continues to influence her behavior today. [name] shared her thoughts and insights about the experiment and its outcome. [name] shared that the evidenced showed that the belief was untrue. Client and clinician dicussed what might change for [name] if she replaced that belief. Client and clinician discussed who she would be without that belief. Christina [name] and clinician discussed how she "try on" other beliefs to see how they fit her. [name] shared that several patients have complimented her lately which tells her that her efforts are paying off. Christina [name] and clinician discussed the fact that her emotional regulation is improving. Client and clinician discussed what is happening to make this possible. [name] agreed to continue her homework until next time.  Plan: Return again in 1 weeks.  Diagnosis:     Axis I: Major depressive disorder, recurrent episode, moderate and PTSD (post-traumatic stress  disorder).    Andromeda Poppen A, LCSW 04/27/2015

## 2015-05-04 ENCOUNTER — Ambulatory Visit (HOSPITAL_COMMUNITY): Payer: Self-pay | Admitting: Clinical

## 2015-05-11 ENCOUNTER — Ambulatory Visit (INDEPENDENT_AMBULATORY_CARE_PROVIDER_SITE_OTHER): Payer: 59 | Admitting: Clinical

## 2015-05-11 ENCOUNTER — Encounter (HOSPITAL_COMMUNITY): Payer: Self-pay | Admitting: Clinical

## 2015-05-11 DIAGNOSIS — F331 Major depressive disorder, recurrent, moderate: Secondary | ICD-10-CM | POA: Diagnosis not present

## 2015-05-11 DIAGNOSIS — F4310 Post-traumatic stress disorder, unspecified: Secondary | ICD-10-CM | POA: Diagnosis not present

## 2015-05-11 NOTE — Progress Notes (Signed)
   THERAPIST PROGRESS NOTE  Session Time: 7:03 - 8:00  Participation Level: Active  Behavioral Response: CasualAlertNA  Type of Therapy: Individual Therapy  Treatment Goals addressed: Improve psychiatric symptoms, Improve faulty thinking patterns, emotional regulation skills, stress reduction techniques  Interventions: CBT, Motivational Interviewing, grounding and mindfulness techniques  Summary: Christina [name] is a 69 y.o. female who presents with Major depressive disorder, recurrent episode, moderate and PTSD (post-traumatic stress disorder).  Suicidal/Homicidal: No - without intent/plan  Therapist Response: [name] met with clinician for an individual session. [name] was accompanied by her friend [name], for support.  [name] discussed her psychiatric symptoms, her current life events and her homework. [name] shared that she had taken a few days vacation and so slacked on her paper homework. She shared that she continued to do her daily skill practices. [name] shared that she recognizes that her mood is improving. [name] and clinician discussed her thoughts and insights about her mood. [name] shared that she recognizes that others are responding to her in a more positive manner. She shared that she still has to make a conscious effort to challenge her negative thoughts and some of her behaviors, but that the more she does it the better results she gets. [name] and clinician discussed her process of challenging negative thoughts and beliefs.  [name] and clinician discussed and reviewed her stress reduction and emotional regulation techniques. [name] gave examples of when she was successful with the techniques as well as where she could use improvement. Client and clinician discussed ways she could continue to improve. [name] shared that her changing behavior is helping her with her very important relationship with her granddaughter.  Plan: Return again in 2 weeks.  Diagnosis:     Axis I: Major depressive disorder,  recurrent episode, moderate and PTSD (post-traumatic stress disorder).    Daphene Chisholm A, LCSW 05/11/2015

## 2015-05-18 ENCOUNTER — Ambulatory Visit (HOSPITAL_COMMUNITY): Payer: Self-pay | Admitting: Clinical

## 2015-05-25 ENCOUNTER — Ambulatory Visit (INDEPENDENT_AMBULATORY_CARE_PROVIDER_SITE_OTHER): Payer: 59 | Admitting: Clinical

## 2015-05-25 ENCOUNTER — Encounter (HOSPITAL_COMMUNITY): Payer: Self-pay | Admitting: Clinical

## 2015-05-25 DIAGNOSIS — F331 Major depressive disorder, recurrent, moderate: Secondary | ICD-10-CM

## 2015-05-25 DIAGNOSIS — F4310 Post-traumatic stress disorder, unspecified: Secondary | ICD-10-CM

## 2015-05-25 NOTE — Progress Notes (Signed)
   THERAPIST PROGRESS NOTE  Session Time: 7:00 - 7:55  Participation Level: Active  Behavioral Response: NeatAlertDepressed  Type of Therapy: Individual Therapy  Treatment Goals addressed: Improve psychiatric symptoms, Improve faulty thinking patterns, emotional regulation skills, stress reduction techniques  Interventions: CBT, Motivational Interviewing, grounding and mindfulness techniques  Summary: Christina [name] is [name] 69 y.o. female who presents with Major depressive disorder, recurrent episode, moderate and PTSD (post-traumatic stress disorder).  Suicidal/Homicidal: No - without intent/plan  Therapist Response: Christina [name] met with clinician for an individual session.   Christina [name] discussed her psychiatric symptoms, her current life events and her homework.  Christina [name] shared that she continues to feel [name] little better. She shared that she is starting to recognize the areas of her life that she has the ability to control. Christina [name] shared that while her bosses at work are "not right" she is able to have positive interactions with the clients which improves her day. Since she began to pay attention to her interactions with others she has noticed that she is getting [name] lot more compliments. 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 Client and clinician discussed this knowledge affects her stress level. Christina [name] shared that knowing she has some control whether it is at work or at home or else where has been very helpful. Christina [name] shared that she continues to improve in her emotional regulation skills. She shared that she has not been getting as irritated or angry. She shared that she spent the day with her granddaughter this past weekend and her Granddaughter reported really enjoying her time. Christina [name] attributed this to her being more relaxed and patient. Christina [name] and clinician reviewed and discussed her homework. Christina [name] and clinician discussed her packet and her grounding and mindfulness  techniques. Christina [name] and clinician also discussed the areas that Christina [name] could improve. Christina [name] shared about some of the things that make her stress and her reactions that she would like to improve. Christina [name] agreed to continue her homework until next session  Plan: Return again in 1 weeks.  Diagnosis:     Axis I: Major depressive disorder, recurrent episode, moderate and PTSD (post-traumatic stress disorder).   Christina Fetherolf A, LCSW 05/25/2015

## 2015-06-08 ENCOUNTER — Ambulatory Visit (HOSPITAL_COMMUNITY): Payer: Self-pay | Admitting: Clinical

## 2015-06-22 ENCOUNTER — Ambulatory Visit (INDEPENDENT_AMBULATORY_CARE_PROVIDER_SITE_OTHER): Payer: Medicare Other | Admitting: Adult Health

## 2015-06-22 ENCOUNTER — Encounter: Payer: Self-pay | Admitting: Adult Health

## 2015-06-22 VITALS — BP 145/90 | HR 72 | Ht 63.0 in | Wt 174.0 lb

## 2015-06-22 DIAGNOSIS — R413 Other amnesia: Secondary | ICD-10-CM

## 2015-06-22 NOTE — Progress Notes (Signed)
PATIENT: Christina [name] DOB: [birth year]  REASON FOR VISIT: follow up- memory loss HISTORY FROM: patient  HISTORY OF PRESENT ILLNESS: Christina [name] is a 69 year old female with a history of memory loss. She returns today for follow-up. The patient's neuropsychological testing indicated some psychological distress and recommended a referral to psychiatry. The patient has had several sessions with a counselor at behavioral health. She has an appointment with Dr. Adele Schilder. She feels that her memory has improved. As far as her mood she states that she has good days and bad days. She is able to complete all ADLs independently. She operates a Teacher, music without difficulty. She denies any new medical issues. She returns today for an evaluation.  HISTORY 12/26/14:  Christina [name] is a 69 year old female with a history of memory loss. She returns today for follow-up. The patient was referred to psychiatry after her neuropsychological evaluation. She states that she has an appointment with a therapist on Wednesday. She states that she cannot get appointment with a psychiatrist until October. Patient states that she has good and bad days with her memory. She continues to live alone. She is able to complete all ADLs independently. 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 She denies having to give up anything due to her memory. The patient was started on Wellbutrin since the last visit. She feels that this has helped her mood. She returns today for evaluation.   REVIEW OF SYSTEMS: Out of a complete 14 system review of symptoms, the patient complains only of the following symptoms, and all other reviewed systems are negative.  See history of present illness  ALLERGIES: Allergies  Allergen Reactions  . Celexa [Citalopram Hydrobromide] Other (See Comments)    GENERIC BRAND makes pt nervous  . Pork-Derived Products     Very sick on stomach  . Synthroid  [Levothyroxine Sodium]     Shakes   . Valium [Diazepam]     Pt became addicted. Not an allergy    HOME MEDICATIONS: Outpatient Prescriptions Prior to Visit  Medication Sig Dispense Refill  . Acai 500 MG CAPS Take 2 capsules by mouth daily.    Marland Kitchen aspirin 325 MG tablet Take 325 mg by mouth as needed.     Marland Kitchen atenolol (TENORMIN) 25 MG tablet Take 25 mg by mouth daily.    . beta carotene 15 MG capsule Take 15 mg by mouth daily.    Marland Kitchen buPROPion (WELLBUTRIN XL) 300 MG 24 hr tablet Take 300 mg by mouth daily.    . cholecalciferol (VITAMIN D) 1000 UNITS tablet Take 2,000 Units by mouth daily.     . Cyanocobalamin (B-12 IJ) Inject as directed as directed.    . folic acid (FOLVITE) 222 MCG tablet Take 800 mcg by mouth daily.     . Lactobacillus-Inulin (CULTURELLE DIGESTIVE HEALTH PO) Take 1 capsule by mouth.    . Lecithin 1200 MG CAPS Take 1 capsule by mouth daily.    . Melatonin 3 MG CAPS Take 1 capsule by mouth daily.    . Misc Natural Products (ADRENAL PO) Take 1 capsule by mouth daily.    . Multiple Vitamin (MULTIVITAMIN WITH MINERALS) TABS Take 1 tablet by mouth daily.    Marland Kitchen selenium 50 MCG TABS Take 200 mcg by mouth daily.    Marland Kitchen thiamine (VITAMIN B-1) 100 MG tablet Take 100 mg by mouth daily.    . THYROID PO Take 1 capsule by mouth.    Marland Kitchen  traMADol (ULTRAM) 50 MG tablet Take 1 tablet (50 mg total) by mouth every 6 (six) hours as needed for pain. 15 tablet 0  . vitamin E 400 UNIT capsule Take 400 Units by mouth daily.     No facility-administered medications prior to visit.    PAST MEDICAL HISTORY: Past Medical History  Diagnosis Date  . Arthritis   . Chicken pox   . Depression   . Positive TB test   . Blood transfusion   . Family history of thyroid problem   . HTN (hypertension)   . Memory deficits 12/16/2013  . Irritable bowel syndrome   . Degenerative arthritis     PAST SURGICAL HISTORY: Past Surgical History  Procedure Laterality Date  . Tonsillectomy  1951  . Thyroid surgery   1968  . Adenoidectomy    . D [place]    . Dilation and curettage of uterus      FAMILY HISTORY: Family History  Problem Relation Age of Onset  . Arthritis Mother   . Hyperlipidemia Mother   . Hypertension Mother   . Dementia Mother   . Arthritis Father   . Prostate cancer Father   . Hypertension Father   . Arthritis Maternal Grandmother   . Hypertension Maternal Grandmother   . Arthritis Maternal Grandfather   . Hypertension Maternal Grandfather   . Arthritis Paternal Grandmother   . Hypertension Paternal Grandmother   . Arthritis Paternal Grandfather   . Hypertension Paternal Grandfather   . Diabetes Paternal Grandfather   . Heart disease Sister     congenital heart disease  . Stroke Sister   . Dementia Sister   . Cancer - Colon Brother   . Stroke Brother   . Other Brother   . Pulmonary embolism Brother   . Other Brother   . Stroke Brother   . Drug abuse Brother   . Other Brother   . Schizophrenia Brother   . Other Brother     SOCIAL HISTORY: Social History   Social History  . Marital Status: Divorced    Spouse Name: N/A  . Number of Children: 4  . Years of Education: college 1   Occupational History  . FLOATER    Social History Main Topics  . Smoking status: Never Smoker   . Smokeless tobacco: Never Used  . Alcohol Use: 0.0 oz/week    0 Standard drinks or equivalent per week     Comment: rare  . Drug Use: No  . Sexual Activity: No   Other Topics Concern  . Not on file   Social History Narrative   Patient lives at home and she is widowed.    Patient works full time.   Education one year of college.   Right handed.   Caffeine rare sweet tea.      PHYSICAL EXAM  Filed Vitals:   06/22/15 0750  BP: 145/90  Pulse: 72  Height: 5\' 3"  (1.6 m)  Weight: 174 lb (78.926 kg)   Body mass index is 30.83 kg/(m^2).  Generalized: Well developed, in no acute distress   Neurological examination  Mentation: Alert oriented to time, place, history  taking. Follows all commands speech and language fluent Cranial nerve II-XII: Pupils were equal round reactive to light. Extraocular movements were full, visual field were full on confrontational test. Facial sensation and strength were normal. Uvula tongue midline. Head turning and shoulder shrug  were normal and symmetric. Motor: The motor testing reveals 5 over 5 strength of all 4  extremities. Good symmetric motor tone is noted throughout.  Sensory: Sensory testing is intact to soft touch on all 4 extremities. No evidence of extinction is noted.  Coordination: Cerebellar testing reveals good finger-nose-finger and heel-to-shin bilaterally.  Gait and station: Gait is normal. Tandem gait is normal. Romberg is negative. No drift is seen.  Reflexes: Deep tendon reflexes are symmetric and normal bilaterally.   DIAGNOSTIC DATA (LABS, IMAGING, TESTING) - I reviewed patient records, labs, notes, testing and imaging myself where available.  ASSESSMENT AND PLAN 69 y.o. year old female  has a past medical history of Arthritis; Chicken pox; Depression; Positive TB test; Blood transfusion; Family history of thyroid problem; HTN (hypertension); Memory deficits (12/16/2013); Irritable bowel syndrome; and Degenerative arthritis. here with:  1. Memory loss  Overall the patient is doing well. Her memory score has remained stable. She has been following up with counselors at behavioral health and has an appointment with a psychiatrist next month. Denies any changes in her memory. Patient advised that if her symptoms worsen or she develops new symptoms she will let us know. She will follow-up in 6 months or sooner if needed.  Ward Givens, MSN, NP-C 06/22/2015, 7:51 AM [facility] [address], [place], [place] [address] [phone]

## 2015-06-22 NOTE — Patient Instructions (Signed)
Memory score is good. Follow-up with Dr. Adele Schilder If your symptoms worsen or you develop new symptoms please let us know.

## 2015-06-22 NOTE — Progress Notes (Signed)
I have read the note, and I agree with the clinical assessment and plan.  Christina [name],Christina [name]   

## 2015-07-04 ENCOUNTER — Ambulatory Visit (HOSPITAL_COMMUNITY): Payer: Self-pay | Admitting: Psychiatry

## 2015-08-02 ENCOUNTER — Ambulatory Visit (INDEPENDENT_AMBULATORY_CARE_PROVIDER_SITE_OTHER): Payer: 59 | Admitting: Psychiatry

## 2015-08-02 ENCOUNTER — Encounter (HOSPITAL_COMMUNITY): Payer: Self-pay | Admitting: Psychiatry

## 2015-08-02 DIAGNOSIS — F4310 Post-traumatic stress disorder, unspecified: Secondary | ICD-10-CM

## 2015-08-02 DIAGNOSIS — F329 Major depressive disorder, single episode, unspecified: Secondary | ICD-10-CM | POA: Diagnosis not present

## 2015-08-02 DIAGNOSIS — F32A Depression, unspecified: Secondary | ICD-10-CM

## 2015-08-02 MED ORDER — CELEXA 10 MG PO TABS: 10.0000 mg | ORAL_TABLET | Freq: Every day | ORAL | Status: DC
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
## 2015-08-08 ENCOUNTER — Telehealth (HOSPITAL_COMMUNITY): Payer: Self-pay | Admitting: *Deleted

## 2015-08-08 NOTE — Telephone Encounter (Signed)
Prior authorization for Celexa received. Called [id number] spoke with Margreta Journey who gave approval for commerical insurance until 08/07/16 Josem Kaufmann [id number] and also gave approval for AARP part D insurance good until 09/22/16  Josem Kaufmann [id number]. Attempted to fill out online with cover my meds but it would not go through.

## 2015-08-22 ENCOUNTER — Telehealth: Payer: Self-pay | Admitting: Psychology

## 2015-08-22 NOTE — Telephone Encounter (Signed)
error 

## 2015-08-29 ENCOUNTER — Telehealth (HOSPITAL_COMMUNITY): Payer: Self-pay

## 2015-08-29 NOTE — Telephone Encounter (Signed)
Medication mangement - Fax received to confirm patient's Celexa was approved for coverage.

## 2015-09-05 ENCOUNTER — Telehealth (HOSPITAL_COMMUNITY): Payer: Self-pay

## 2015-09-05 ENCOUNTER — Encounter (HOSPITAL_COMMUNITY): Payer: Self-pay | Admitting: Psychiatry

## 2015-09-05 ENCOUNTER — Ambulatory Visit (INDEPENDENT_AMBULATORY_CARE_PROVIDER_SITE_OTHER): Payer: 59 | Admitting: Psychiatry

## 2015-09-05 DIAGNOSIS — F329 Major depressive disorder, single episode, unspecified: Secondary | ICD-10-CM

## 2015-09-05 DIAGNOSIS — F4310 Post-traumatic stress disorder, unspecified: Secondary | ICD-10-CM

## 2015-09-05 DIAGNOSIS — F32A Depression, unspecified: Secondary | ICD-10-CM

## 2015-09-05 MED ORDER — CELEXA 10 MG PO TABS: 10.0000 mg | ORAL_TABLET | Freq: Every day | ORAL | Status: DC

## 2015-09-05 NOTE — Telephone Encounter (Signed)
Met with patient who reported she could not get Celexa name brand from [facility] as they no longer take [employer] and would cost her close to $300 a month.  Patient stated she needed name brand but wanted to get a printed prescription that she could take to other pharmacies so they could run it and let her know how much it would cost with her insurance.  States she wants to take it to another pharmacy that takes her [employer].  Discussed with Dr. Adele Schilder who authorized the new order.  New order printed and signed by Dr. Adele Schilder after another one was e-scribed to [facility] by mistake.  Patient given printed prescription as she will go to another pharmacy to fill.  [place] and spoke with Dorothea Ogle  to cancel out order sent from today and one e-scribed on 08/02/15 since patient cannot get medication there.  Patient to call back if any problems getting medication filled.

## 2015-09-07 NOTE — Progress Notes (Signed)
THERAPIST PROGRESS NOTE  Session Time: 7:00 - 7:55  Participation Level: Active  Behavioral Response: NeatAlertDepressed  Type of Therapy: Individual Therapy  Treatment Goals addressed: Improve psychiatric symptoms, Improve faulty thinking patterns, emotional regulation skills, stress reduction techniques  Interventions: person-centered, rapport building, introduction to new therapist  Summary: Christina [name] is a 69 y.o. female who presents with Major depressive disorder, recurrent episode, moderate and PTSD (post-traumatic stress disorder).  Suicidal/Homicidal: No - without intent/plan  Therapist Response: Pt. Met with counselor, joined in session with friend Christina [name]). Pt. Presents with primarily flat affect, cooperative and responsive to questions from counselor, but generally defers to [facility] for assistance in responding to questions. Pt. Reported "I have been depressed my whole life". When asked what her general goals were for therapy, Pt. Responded "to get help with my depression". Pt. Discussed four children Christina [name] (38), Christina [name] (36), Christina [name] (79), and Christina [name] (32). Pt. Reports that her friend Christina [name] raised her 21 year old son as her own, but neither will go into detail about circumstances that required this son to be raised apart from his siblings. Pt. Reports conflictual relationship with her daughter differences in religious beliefs and belief that her daughter attempts to isolage the 58 year old granddaughter. Pt. Reports that she is able to go to work consistently, but cites relationships with her Public house manager as source of conflict. 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 Pt. Reports that "he is disrespectful to staff"; however, Pt. Reports that she is effective at work and takes pride in the relationships with patients and able to be generally helpful in the office. Christina [name] provides her belief that Pt.'s depression is worsened  by her homelife and history of chronic disorganization. Christina [name] provides that she is actively organizing bills and helping to clean Pt.'s home so that she can host her four children during the holidays and is hopeful that once the home is clean that pt. Will be able to take on the responsibility of cleaning the home by herself. Christina [name] reports that pt. Engages in behaviors consistent with hoarding such as collecting tools and cleaning supplies, but is not able to follow through with plans to clean her home. Pt. Reports strengths including knowing how to change battery, oil, and fluids in car. Pt. And friend present as co-dependent. Christina [name] does not drive and depends on Pt. For transportation. Pt. Is less verbal and presents as less confident in discussing her family history and origins of her depression. Pt. Also demonstrates low level of confidence in her ability to take steps independently in organizing her home. Pt. Presents as passive in session, but reports and supported by Christina [name] that she can become aggressive when she feels threatened. Christina [name] provides that she and Pt. Have recently started recreational dancing a few times a month and she enjoys this activity. Pt. Reports that she has a healthy diet with plenty of fruit, vegetables, quality protein and fats, and vitamin supplementation and fasts with green drinks every Friday and Monday. Pt. Reports that she walks and moves around a lot at work. Pt. Reports that her sleep is irregular, but on average she sleeps between 6-9 hours/night. Pt. And Christina [name] report that Catholic faith is significant resource for internal strength and hopefulness. It is the counselor's impression that Pt.'s progress will be challenged by the current and long history of co-dependence in the relationship.  Plan: Return again in 1 weeks.  Diagnosis:Axis I:  Major depressive disorder, recurrent episode, moderate and PTSD (post-traumatic stress disorder).  [name], [facility] 09/07/2015

## 2015-09-26 ENCOUNTER — Encounter (HOSPITAL_COMMUNITY): Payer: Self-pay | Admitting: Psychiatry

## 2015-09-26 ENCOUNTER — Ambulatory Visit (INDEPENDENT_AMBULATORY_CARE_PROVIDER_SITE_OTHER): Payer: 59 | Admitting: Psychiatry

## 2015-09-26 VITALS — BP 140/90 | Wt 170.0 lb

## 2015-09-26 DIAGNOSIS — F39 Unspecified mood [affective] disorder: Secondary | ICD-10-CM

## 2015-09-26 DIAGNOSIS — F4310 Post-traumatic stress disorder, unspecified: Secondary | ICD-10-CM | POA: Diagnosis not present

## 2015-09-26 DIAGNOSIS — F331 Major depressive disorder, recurrent, moderate: Secondary | ICD-10-CM

## 2015-09-26 MED ORDER — CELEXA 20 MG PO TABS: 20.0000 mg | ORAL_TABLET | Freq: Every day | ORAL | Status: DC
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
## 2015-10-10 ENCOUNTER — Ambulatory Visit (INDEPENDENT_AMBULATORY_CARE_PROVIDER_SITE_OTHER): Payer: 59 | Admitting: Psychiatry

## 2015-10-10 ENCOUNTER — Telehealth (HOSPITAL_COMMUNITY): Payer: Self-pay

## 2015-10-10 DIAGNOSIS — F331 Major depressive disorder, recurrent, moderate: Secondary | ICD-10-CM

## 2015-10-10 DIAGNOSIS — F4310 Post-traumatic stress disorder, unspecified: Secondary | ICD-10-CM | POA: Diagnosis not present

## 2015-10-10 MED ORDER — CITALOPRAM HYDROBROMIDE 10 MG PO TABS: 10.0000 mg | ORAL_TABLET | Freq: Two times a day (BID) | ORAL | Status: DC

## 2015-10-10 NOTE — Telephone Encounter (Signed)
Patient came to office requesting to change her Celexa from 20 mg to 10 mg twice a day due to cost.  She mentioned that brand name Celexa 20 mg is very expensive and she cannot afford.  Patient does not want generic I like to try ran name 10 mg twice a day.  We will authorize.

## 2015-10-12 NOTE — Progress Notes (Signed)
   THERAPIST PROGRESS NOTE  Session Time: 3:00-3:55  Participation Level: Active  Behavioral Response: NeatAlertDepressed  Type of Therapy: Individual Therapy  Treatment Goals addressed: Improve psychiatric symptoms, Improve faulty thinking patterns, emotional regulation skills, stress reduction techniques  Interventions: person-centered, rapport building, behavioral therapy  Summary: Christina [name] is a 69 y.o. female who presents with Major depressive disorder, recurrent episode, moderate and PTSD (post-traumatic stress disorder).  Suicidal/Homicidal: No - without intent/plan  Therapist Response: Pt. Met with counselor, joined in session with friend ([name]). Pt. Reports that her medication was increased and that she feels less depressed- [name] supports that her mood has been improved since increase in medication. Pt. Continues to present with primarily flat affect, emotionally disconnected. Pt. And [name] report that Pt. Was able to make progress by cleaning her home, primarily with [name]'s assistance and she has been successful maintaining her home. Pt. Reports that because of her progress in her home that her two son's were able to spend the holidays comfortably in her home. Pt. Reports ongoing friction in relationship with her daughter and concerns about anti-social behavior with the grandaughter i.e., lack of empathy, harming to animals, manipulation, difficulty making meaningful relationships at school. Pt. Continues to seek out the granddaughter and has visits as often as every weekend with the mother will allow. Pt. 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 Counselor discussed creating plan for job transition and pt. States resistance and fear related to job seeking because of her age. [name] and Pt. Reported problems with short term memory loss. Counselor discussed compensatory behaviors including keeping a calendar  and/or journal, using sticky notes as reminders. Counselor gave Pt. Directions for downloading penzu in session. Pt. Was able to follow the directions and [name] observed that she was able to follow directions without getting anxious or frustrated. Pt. Made verbal commitment to begin writing for memory reinforcement and find a system that works for her including recording her day for reinforcement of memory and creating daily lists and reminders for herself.   Plan: Return again in 3 weeks.  Diagnosis:Axis I: Major depressive disorder, recurrent episode, moderate and PTSD (post-traumatic stress disorder).   Brown, Jennifer B, LPC 10/12/2015  

## 2015-10-24 ENCOUNTER — Telehealth (HOSPITAL_COMMUNITY): Payer: Self-pay

## 2015-10-24 ENCOUNTER — Ambulatory Visit (INDEPENDENT_AMBULATORY_CARE_PROVIDER_SITE_OTHER): Payer: 59 | Admitting: Psychiatry

## 2015-10-24 DIAGNOSIS — F329 Major depressive disorder, single episode, unspecified: Secondary | ICD-10-CM

## 2015-10-24 DIAGNOSIS — F331 Major depressive disorder, recurrent, moderate: Secondary | ICD-10-CM

## 2015-10-24 DIAGNOSIS — F32A Depression, unspecified: Secondary | ICD-10-CM

## 2015-10-24 MED ORDER — CELEXA 10 MG PO TABS: 20.0000 mg | ORAL_TABLET | Freq: Every day | ORAL | Status: DC

## 2015-10-24 NOTE — Telephone Encounter (Signed)
Met with patient with her friend, Christina [name] who reported she felt there was a miscommunication with Dr. [name] recently over patient's Celexa medication.  Patient and her friend reported patient did not want to continue to take generic Celexa as "this knocks her out" per her friend.  Patient reported she understood name brand Celexa would have to be paid for by her but states Celexa 20 mg, #30 one a day costs her $248.74 per month compared to Celexa 10 mg, #60 two a day that costs $190.00. Patient and friend requested Dr. [name] change patient to Celexa 10 mg, 2 a day and send in a new order to her requested [facility] on [address].  Agreed to discuss with Dr. [name] to inform of patient request and to call patient back if any problems getting order changed to how she would like it prescribed. Patient agreed with plan and signed a consent for her friend to be able to call and find out information for her when needed.  Met with Dr. [name] to explain patient's request and he authorized this nurse to send in a name brand order for Celexa 10 mg, 2 a day, #60 with one refill to last until patient's return on 11/27/15.  E-scribed in a new name brand order for Celexa 65m, 2 a day, #60 with one refill as authorized by Dr. AAdele [name] Left patient's friend, Ms. EEdmonia Lynchper patient request a message her requested new name brand order was e-scribed into the [facility] she requested on [id number]and requested they call back if any problems getting medication filled.

## 2015-10-26 NOTE — Progress Notes (Signed)
   THERAPIST PROGRESS NOTE  Session Time: 1:05-1:55  Participation Level: Active  Behavioral Response: NeatAlertDepressed  Type of Therapy: Individual Therapy  Treatment Goals addressed: Improve psychiatric symptoms, Improve faulty thinking patterns, emotional regulation skills, stress reduction techniques  Interventions: person-centered, rapport building, behavioral therapy  Summary: Christina [name] is a 70 y.o. female who presents with Major depressive disorder, recurrent episode, moderate and PTSD (post-traumatic stress disorder).  Suicidal/Homicidal: No - without intent/plan  Therapist Response: Pt. Met with counselor, joined in session with friend Christina [name]). Pt. Presents with primarily flat affect, emotionally disengaged from process. Pt. Reports communication challenge with psychiatrist related to having anti-depressant filled as two 10 mg instead of 1 20 mg because of cheaper price. Pt. Reports that her work continues to be her most significant stressor. Pt. 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 Pt. Reports that she would like to find a job, but worried that she would not be able to because of her age and memory problems. Pt. Reports that she drives without difficulty, but Christina [name] adds that she has difficulty with concentration while driving. Pt. Was asked by counselor how she has done with memory exercises and notetaking as discussed during out last session. Pt. Reports that she has not followed through with these tasks. Counselor reviewed grounding sequence and yoga postures for concentration and emotion regulation. Pt. Was also given the schedule for mental health association to help with socialization.  Plan: Return again in 3 weeks.  Diagnosis:Axis I: Major depressive disorder, recurrent episode,  moderate and PTSD (post-traumatic stress disorder).  [name], [facility] 10/26/2015

## 2015-11-07 ENCOUNTER — Ambulatory Visit (INDEPENDENT_AMBULATORY_CARE_PROVIDER_SITE_OTHER): Payer: 59 | Admitting: Psychiatry

## 2015-11-07 DIAGNOSIS — F329 Major depressive disorder, single episode, unspecified: Secondary | ICD-10-CM | POA: Diagnosis not present

## 2015-11-07 DIAGNOSIS — F32A Depression, unspecified: Secondary | ICD-10-CM
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
## 2015-11-27 ENCOUNTER — Encounter (HOSPITAL_COMMUNITY): Payer: Self-pay | Admitting: Psychiatry

## 2015-11-27 ENCOUNTER — Ambulatory Visit (INDEPENDENT_AMBULATORY_CARE_PROVIDER_SITE_OTHER): Payer: 59 | Admitting: Psychiatry

## 2015-11-27 VITALS — BP 140/38 | HR 61 | Ht 63.0 in | Wt 176.0 lb

## 2015-11-27 DIAGNOSIS — F4310 Post-traumatic stress disorder, unspecified: Secondary | ICD-10-CM

## 2015-11-27 DIAGNOSIS — F329 Major depressive disorder, single episode, unspecified: Secondary | ICD-10-CM

## 2015-11-27 DIAGNOSIS — F32A Depression, unspecified: Secondary | ICD-10-CM

## 2015-11-27 MED ORDER — CELEXA 10 MG PO TABS: 20.0000 mg | ORAL_TABLET | Freq: Every day | ORAL | Status: DC
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
## 2015-12-06 ENCOUNTER — Ambulatory Visit (HOSPITAL_COMMUNITY): Payer: Self-pay | Admitting: Psychiatry

## 2015-12-19 ENCOUNTER — Ambulatory Visit (HOSPITAL_COMMUNITY): Payer: Self-pay | Admitting: Psychiatry

## 2015-12-20 ENCOUNTER — Encounter: Payer: Self-pay | Admitting: Neurology

## 2015-12-20 ENCOUNTER — Ambulatory Visit (INDEPENDENT_AMBULATORY_CARE_PROVIDER_SITE_OTHER): Payer: Medicare Other | Admitting: Neurology

## 2015-12-20 VITALS — BP 142/84 | HR 59 | Ht 63.0 in | Wt 177.5 lb

## 2015-12-20 DIAGNOSIS — R413 Other amnesia: Secondary | ICD-10-CM

## 2015-12-20 NOTE — Progress Notes (Signed)
Reason for visit: Memory disturbance  Christina [name] is an 70 y.o. female  History of present illness:  Christina [name] is a 70 year old right-handed white female with a history of a memory disturbance. The patient continues to work, she is able to manage her job relatively well. She has had no change in her activities of daily living. The patient believes that her memory has slightly changed over time however. She is having some increased problems with directions while driving. The patient is able to keep up with her medications and appointments, she requires some assistance with keeping up with financial activities. The patient does not cook. She indicates that she is sleeping fairly well, she does have some drowsiness and sleepiness during the day,  but this is not severe. The patient is not on any medications for memory, she takes ginkgo and a product called Focus Factor. She returns for an evaluation. The patient has been relatively stable on her Mini-Mental status examinations.  Past Medical History  Diagnosis Date  . Arthritis   . Chicken pox   . Depression   . Positive TB test   . Blood transfusion   . Family history of thyroid problem   . HTN (hypertension)   . Memory deficits 12/16/2013  . Irritable bowel syndrome   . Degenerative arthritis     Past Surgical History  Procedure Laterality Date  . Tonsillectomy  1951  . Thyroid surgery  1968  . Adenoidectomy    . D [place]    . Dilation and curettage of uterus      Family History  Problem Relation Age of Onset  . Arthritis Mother   . Hyperlipidemia Mother   . Hypertension Mother   . Dementia Mother   . Arthritis Father   . Prostate cancer Father   . Hypertension Father   . Arthritis Maternal Grandmother   . Hypertension Maternal Grandmother   . Arthritis Maternal Grandfather   . Hypertension Maternal Grandfather   . Arthritis Paternal Grandmother   . Hypertension Paternal Grandmother   . Arthritis Paternal  Grandfather   . Hypertension Paternal Grandfather   . Diabetes Paternal Grandfather   . Heart disease Sister     congenital heart disease  . Stroke Sister   . Dementia Sister   . Cancer - Colon Brother   . Stroke Brother   . Other Brother   . Pulmonary embolism Brother   . Other Brother   . Stroke Brother   . Drug abuse Brother   . Other Brother   . Schizophrenia Brother   . Other Brother     Social history:  reports that she has never smoked. She has never used smokeless tobacco. She reports that she drinks alcohol. She reports that she does not use illicit drugs.    Allergies  Allergen Reactions  . Pork-Derived Products     Very sick on stomach  . Synthroid [Levothyroxine Sodium]     Shakes   . Valium [Diazepam]     Pt became addicted. Not an allergy    Medications:  Prior to Admission medications   Medication Sig Start Date End Date Taking? Authorizing Provider  Acai 500 MG CAPS Take 2 capsules by mouth daily.    Historical Provider, MD  aspirin 325 MG tablet Take 325 mg by mouth as needed.     Historical Provider, MD  atenolol (TENORMIN) 25 MG tablet Take 25 mg by mouth daily.    Historical Provider, MD  beta  carotene 15 MG capsule Take 15 mg by mouth daily.    Historical Provider, MD  CELEXA 10 MG tablet Take 2 tablets (20 mg total) by mouth daily. 11/27/15   Kathlee Nations, MD  cholecalciferol (VITAMIN D) 1000 UNITS tablet Take 2,000 Units by mouth daily.     Historical Provider, MD  Cyanocobalamin (B-12 IJ) Inject as directed as directed.    Historical Provider, MD  folic acid (FOLVITE) Q000111Q MCG tablet Take 800 mcg by mouth daily.     Historical Provider, MD  Lactobacillus-Inulin ([place] PO) Take 1 capsule by mouth.    Historical Provider, MD  Lecithin 1200 MG CAPS Take 1 capsule by mouth daily.    Historical Provider, MD  Melatonin 3 MG CAPS Take 1 capsule by mouth daily.    Historical Provider, MD  Misc Natural Products (ADRENAL PO) Take 1  capsule by mouth daily.    Historical Provider, MD  Multiple Vitamin (MULTIVITAMIN WITH MINERALS) TABS Take 1 tablet by mouth daily.    Historical Provider, MD  selenium 50 MCG TABS Take 200 mcg by mouth daily.    Historical Provider, MD  thiamine (VITAMIN B-1) 100 MG tablet Take 100 mg by mouth daily.    Historical Provider, MD  THYROID PO Take 1 capsule by mouth.    Historical Provider, MD  vitamin E 400 UNIT capsule Take 400 Units by mouth daily.    Historical Provider, MD    ROS:  Out of a complete 14 system review of symptoms, the patient complains only of the following symptoms, and all other reviewed systems are negative.  Fatigue Ringing in the ears, runny nose Rectal bleeding, constipation, diarrhea Frequent waking, daytime sleepiness, snoring Joint pain, joint swelling, back pain, aching muscles, muscle cramps, neck pain, neck stiffness Memory loss, numbness Agitation, behavior problem, confusion, decreased concentration, depression, anxiety  Blood pressure 142/84, pulse 59, height 5\' 3"  (1.6 m), weight 177 lb 8 oz (80.513 kg).  Physical Exam  General: The patient is alert and cooperative at the time of the examination.  Skin: No significant peripheral edema is noted.   Neurologic Exam  Mental status: The patient is alert and oriented x 3 at the time of the examination. The patient has apparent normal recent and remote memory, with an apparently normal attention span and concentration ability. The Mini-Mental Status Examination done today shows a total score of 29/30. The patient is able to name 10 animals in 30 seconds.   Cranial nerves: Facial symmetry is present. Speech is normal, no aphasia or dysarthria is noted. Extraocular movements are full. Visual fields are full.  Motor: The patient has good strength in all 4 extremities.  Sensory examination: Soft touch sensation is symmetric on the face, arms, and legs.  Coordination: The patient has good  finger-nose-finger and heel-to-shin bilaterally.  Gait and station: The patient has a normal gait. Tandem gait is normal. Romberg is negative. No drift is seen.  Reflexes: Deep tendon reflexes are symmetric.   MRI brain 02/11/14:  IMPRESSION: Slightly abnormal MRI scan of the brain showing mild changes of chronic microvascular ischemia. These appears slightly progressed compared with his MRI scan dated 10/27/2005  * MRI scan images were reviewed online. I agree with the written report.    Assessment/Plan:  1. Mild memory disturbance  2. Anxiety and depression  The patient does have some underlying anxiety and depression issues, she is getting some psychological counseling. She does not wish to go on medications for  memory at this time. We will continue to follow conservatively at this time. She has been relatively stable on the Mini-Mental status examinations, but the patient herself has noted some changes in her cognitive functioning. She will follow-up in 9 or 10 months for an evaluation.  Jill Alexanders MD 12/20/2015 8:23 AM  [facility] [address] [place], [place] [address]  Phone [phone] Fax [phone]

## 2016-01-02 ENCOUNTER — Ambulatory Visit (INDEPENDENT_AMBULATORY_CARE_PROVIDER_SITE_OTHER): Payer: 59 | Admitting: Psychiatry

## 2016-01-02 DIAGNOSIS — F32A Depression, unspecified: Secondary | ICD-10-CM

## 2016-01-02 DIAGNOSIS — F329 Major depressive disorder, single episode, unspecified: Secondary | ICD-10-CM | POA: Diagnosis not present

## 2016-01-02 DIAGNOSIS — F331 Major depressive disorder, recurrent, moderate: Secondary | ICD-10-CM
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
## 2016-01-15 ENCOUNTER — Ambulatory Visit (INDEPENDENT_AMBULATORY_CARE_PROVIDER_SITE_OTHER): Payer: 59 | Admitting: Psychiatry

## 2016-01-15 DIAGNOSIS — F329 Major depressive disorder, single episode, unspecified: Secondary | ICD-10-CM

## 2016-01-15 DIAGNOSIS — F32A Depression, unspecified: Secondary | ICD-10-CM

## 2016-01-15 DIAGNOSIS — F4310 Post-traumatic stress disorder, unspecified: Secondary | ICD-10-CM

## 2016-01-16 ENCOUNTER — Ambulatory Visit (HOSPITAL_COMMUNITY): Payer: Self-pay | Admitting: Psychiatry
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
## 2016-01-22 ENCOUNTER — Ambulatory Visit (INDEPENDENT_AMBULATORY_CARE_PROVIDER_SITE_OTHER): Payer: 59 | Admitting: Psychiatry

## 2016-01-22 DIAGNOSIS — F329 Major depressive disorder, single episode, unspecified: Secondary | ICD-10-CM | POA: Diagnosis not present

## 2016-01-22 DIAGNOSIS — F4310 Post-traumatic stress disorder, unspecified: Secondary | ICD-10-CM

## 2016-01-22 DIAGNOSIS — F32A Depression, unspecified: Secondary | ICD-10-CM
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
## 2016-01-29 ENCOUNTER — Ambulatory Visit (INDEPENDENT_AMBULATORY_CARE_PROVIDER_SITE_OTHER): Payer: 59 | Admitting: Psychiatry

## 2016-01-29 DIAGNOSIS — F329 Major depressive disorder, single episode, unspecified: Secondary | ICD-10-CM

## 2016-01-29 DIAGNOSIS — F32A Depression, unspecified: Secondary | ICD-10-CM

## 2016-02-01 NOTE — Progress Notes (Signed)
   THERAPIST PROGRESS NOTE   Session Time: 4:35-5:30   Participation Level: Active  Behavioral Response: NeatAlertDepressed  Type of Therapy: Individual Therapy  Treatment Goals addressed: Improve psychiatric symptoms, Improve faulty thinking patterns, emotional regulation skills, stress reduction techniques  Interventions: person-centered, rapport building, behavioral therapy  Summary: Christina [name] is a 70 y.o. female who presents with Major depressive disorder, recurrent episode, moderate and PTSD (post-traumatic stress disorder).  Suicidal/Homicidal: No - without intent/plan  Therapist Response: Pt. Met with counselor. Pt. Was asked if she felt comfortable meeting with counselor without her friend [name]). Pt. Agreed to have an individual session and explained "it's not that I don't feel comfortable, but [name] explains things better than I do." Pt. Fully engaged in the session, was responsive to counselor questions. Pt. Completed the PCL-5 and PHQ-9 with plans to continue with CPT. Pt. Scored an 11 PTSD indicating very mild PTSD symptoms. Pt. Scored a 10 on the PHQ-9 indicating moderate depression. Pt.'s speech was less constricted in the individual session. Pt. Discussed that she and [name] were starting to get on each other's nerves, but they are friends and continue to depend on one another. Pt. Was asked about desire to work on trauma with CPT. Pt. Stated that she did not feel that the trauma was the most important issue with her. Pt. Was able to identify new goals of assertiveness and self-acceptance.  Plan: Return again in 2 weeks.  Diagnosis:Axis I: Major depressive disorder, recurrent episode, moderate and PTSD (post-traumatic stress disorder).   [name], [facility] 02/01/2016

## 2016-02-05 ENCOUNTER — Ambulatory Visit (INDEPENDENT_AMBULATORY_CARE_PROVIDER_SITE_OTHER): Payer: 59 | Admitting: Psychiatry

## 2016-02-05 DIAGNOSIS — F329 Major depressive disorder, single episode, unspecified: Secondary | ICD-10-CM

## 2016-02-05 DIAGNOSIS — F32A Depression, unspecified: Secondary | ICD-10-CM

## 2016-02-06 ENCOUNTER — Ambulatory Visit (HOSPITAL_COMMUNITY): Payer: Self-pay | Admitting: Psychiatry
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
## 2016-02-12 ENCOUNTER — Ambulatory Visit (HOSPITAL_COMMUNITY): Payer: Self-pay | Admitting: Psychiatry

## 2016-02-20 ENCOUNTER — Ambulatory Visit (INDEPENDENT_AMBULATORY_CARE_PROVIDER_SITE_OTHER): Payer: Medicare Other | Admitting: Psychiatry

## 2016-02-20 DIAGNOSIS — F329 Major depressive disorder, single episode, unspecified: Secondary | ICD-10-CM

## 2016-02-20 DIAGNOSIS — F32A Depression, unspecified: Secondary | ICD-10-CM

## 2016-02-22 NOTE — Progress Notes (Signed)
   THERAPIST PROGRESS NOTE  Session Time: 4:35-5:30   Participation Level: Active  Behavioral Response: NeatAlertDepressed  Type of Therapy: Individual Therapy  Treatment Goals addressed: Improve psychiatric symptoms, Improve faulty thinking patterns, emotional regulation skills, stress reduction techniques  Interventions: person-centered, rapport building, behavioral therapy  Summary: Christina [name] is a 70 y.o. female who presents with Major depressive disorder, recurrent episode, moderate and PTSD (post-traumatic stress disorder).  Suicidal/Homicidal: No - without intent/plan  Therapist Response: Pt. Requested that her friend [name]) join the session. Pt. Continues to rely on [name] to interpret her experiences and assist with communication. Pt. And [name] were confronted by counselor about her friend [name] being more motivated for therapy than she is; Pt. Agreed that [name] was more motivated. Pt. Was asked what she thought would be helpful with her depression moving forward and she stated that she wanted to continue to have the joint sessions with [name]. Pt. Reports that her vacation time was productive, that she did a lot of work with her hands around the house, she was visited by her son who was raised by [name], and she slept a lot. Significant time in the session was spent processing workplace relationships and belief that her job is in jeopardy. Pt. Was given resource of vocational rehabilitation to begin assessment of interests and skills and exploration of other job opportunities. Pt. Was encouraged that she does not have to leave her job, but it would be empowering to see evidence that other options are available to her.  Plan: Return again in 2 weeks.  Diagnosis:Axis I: Major depressive disorder, recurrent episode, moderate and PTSD (post-traumatic stress disorder).    [name], [facility] 02/22/2016

## 2016-02-26 ENCOUNTER — Ambulatory Visit (HOSPITAL_COMMUNITY): Payer: Self-pay | Admitting: Psychiatry

## 2016-02-29 ENCOUNTER — Ambulatory Visit (INDEPENDENT_AMBULATORY_CARE_PROVIDER_SITE_OTHER): Payer: Medicare Other | Admitting: Psychiatry

## 2016-02-29 ENCOUNTER — Encounter (HOSPITAL_COMMUNITY): Payer: Self-pay | Admitting: Psychiatry

## 2016-02-29 VITALS — BP 128/80 | HR 68 | Ht 63.0 in | Wt 178.4 lb

## 2016-02-29 DIAGNOSIS — G4700 Insomnia, unspecified: Secondary | ICD-10-CM

## 2016-02-29 DIAGNOSIS — F329 Major depressive disorder, single episode, unspecified: Secondary | ICD-10-CM

## 2016-02-29 DIAGNOSIS — F32A Depression, unspecified: Secondary | ICD-10-CM

## 2016-02-29 MED ORDER — HYDROXYZINE HCL 10 MG PO TABS: 10.0000 mg | ORAL_TABLET | Freq: Every evening | ORAL | Status: DC | PRN

## 2016-02-29 MED ORDER — CELEXA 10 MG PO TABS
20.0000 mg | ORAL_TABLET | Freq: Every day | ORAL | Status: DC
Start: 2016-02-29 — End: 2016-06-04
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
## 2016-03-04 ENCOUNTER — Ambulatory Visit (INDEPENDENT_AMBULATORY_CARE_PROVIDER_SITE_OTHER): Payer: 59 | Admitting: Psychiatry

## 2016-03-04 DIAGNOSIS — F329 Major depressive disorder, single episode, unspecified: Secondary | ICD-10-CM

## 2016-03-04 DIAGNOSIS — F32A Depression, unspecified: Secondary | ICD-10-CM

## 2016-03-05 NOTE — Progress Notes (Signed)
   THERAPIST PROGRESS NOTE  Session Time: 4:35-5:35   Participation Level: Active  Behavioral Response: NeatAlertDepressed  Type of Therapy: Individual Therapy  Treatment Goals addressed: Improve psychiatric symptoms, Improve faulty thinking patterns, emotional regulation skills, stress reduction techniques  Interventions: person-centered, rapport building, behavioral therapy  Summary: [name] is a 70 y.o. female who presents with Major depressive disorder, recurrent episode, moderate and PTSD (post-traumatic stress disorder).  Suicidal/Homicidal: No - without intent/plan  Therapist Response: Pt. Was joined with her friend [name]) join the session. Counselor followed up on referral to vocational rehabilitation. Pt. Reported that she lost the number for the referral but remained interested in seeking assistance with job exploration. Pt. 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 Pt. Reported that one of her sons had relapse with mood disorder and that [name] was able to help him . Pt. Reported "I do not communicate well with him and [name] is able to help him." Pt. Participated in self-esteem intervention that was designed to assist her with identifying aspects of herself that she is able to recognize and find value. Pt. Was able to identify her ability to respect and build rapport with diverse group of patients. Pt. Also identified her work ethic and ability to get along with others in the workplace. Pt. 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 Pt. Communicates effectively and was given examples and feedback during session regarding how others are able to clearly understand her when she is talking.  Plan: Return again in 2 weeks.  Diagnosis:Axis I: Major depressive disorder, recurrent episode, moderate and PTSD (post-traumatic stress disorder).   [name],  [facility] 03/05/2016

## 2016-03-11 ENCOUNTER — Ambulatory Visit (HOSPITAL_COMMUNITY): Payer: Self-pay | Admitting: Psychiatry

## 2016-03-18 ENCOUNTER — Ambulatory Visit (INDEPENDENT_AMBULATORY_CARE_PROVIDER_SITE_OTHER): Payer: 59 | Admitting: Psychiatry

## 2016-03-18 DIAGNOSIS — F329 Major depressive disorder, single episode, unspecified: Secondary | ICD-10-CM

## 2016-03-18 DIAGNOSIS — F32A Depression, unspecified: Secondary | ICD-10-CM
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
## 2016-03-25 ENCOUNTER — Ambulatory Visit (HOSPITAL_COMMUNITY): Payer: Self-pay | Admitting: Psychiatry

## 2016-04-01 ENCOUNTER — Ambulatory Visit (INDEPENDENT_AMBULATORY_CARE_PROVIDER_SITE_OTHER): Payer: 59 | Admitting: Psychiatry

## 2016-04-01 DIAGNOSIS — F32A Depression, unspecified: Secondary | ICD-10-CM

## 2016-04-01 DIAGNOSIS — F329 Major depressive disorder, single episode, unspecified: Secondary | ICD-10-CM

## 2016-04-02 NOTE — Progress Notes (Signed)
   THERAPIST PROGRESS NOTE  Session Time: 4:35-5:30   Participation Level: Active  Behavioral Response: NeatAlertDepressed  Type of Therapy: Individual Therapy  Treatment Goals addressed: Improve faulty thinking patterns, develop emotional regulation skills, self-esteem development, communication/assertiveness skills  Interventions: person-centered, rapport building, behavioral therapy  Summary: [name] is a 70 y.o. female who presents with Major depressive disorder, recurrent episode, moderate and PTSD (post-traumatic stress disorder).  Suicidal/Homicidal: No - without intent/plan  Therapist Response: Pt. Was joined with her friend [name]). Patterns of co-dependent behavior continue in the session, with [name] completing Pt.'s statements and generally undermining confidence in thoughts and feelings. 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 Pt. Continued to report general resistance to thinking of herself as important as her patients. Pt. Reported that that her sleep had been "up and down". Pt. Reported that she was taking vistaril as needed for sleep and melatonin nightly. Session focused on developing sleep hygiene habits including bathing at night, using melatonin nightly and going to bed and waking at the same time everyday. Pt. Reported that she has been able to read more and attributed this change to better stress management during the day. Pt. 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 Pt. Was interrupted by [name] who stated that Pt. Was good at communicating the details but not good at making more substantive points. Pt. Was given positive feedback by the counselor that there had not been deficits in communication recognized during the sessions and that she had done particularly well during the individual session with examples of good  communication. Pt. Was introduced to the twice weekly therapy group and indicated unwillingness to do groups.  Plan: Return again in 3 weeks. Pt. To continue with CBT based therapy.  Diagnosis:Axis I: Major depressive disorder, recurrent episode, moderate and PTSD (post-traumatic stress disorder).   [name], [facility] 04/02/2016

## 2016-04-22 ENCOUNTER — Ambulatory Visit (HOSPITAL_COMMUNITY): Payer: Self-pay | Admitting: Psychiatry

## 2016-05-20 ENCOUNTER — Ambulatory Visit (INDEPENDENT_AMBULATORY_CARE_PROVIDER_SITE_OTHER): Payer: 59 | Admitting: Psychiatry

## 2016-05-20 DIAGNOSIS — F32A Depression, unspecified: Secondary | ICD-10-CM

## 2016-05-20 DIAGNOSIS — F4310 Post-traumatic stress disorder, unspecified: Secondary | ICD-10-CM

## 2016-05-20 DIAGNOSIS — F329 Major depressive disorder, single episode, unspecified: Secondary | ICD-10-CM | POA: Diagnosis not present

## 2016-05-21 NOTE — Progress Notes (Signed)
   THERAPIST PROGRESS NOTE   Session Time: 3:05-4:00  Participation Level: Active  Behavioral Response: NeatAlertEuthymic  Type of Therapy: Individual Therapy  Treatment Goals addressed: Improve faulty thinking patterns, develop emotional regulation skills, self-esteem development, communication/assertiveness skills  Interventions: person-centered, rapport building, behavioral therapy  Summary: [name] is a 70 y.o. female who presents with Major depressive disorder, recurrent episode, moderate and PTSD (post-traumatic stress disorder).  Suicidal/Homicidal: No - without intent/plan  Therapist Response: Pt. Was joined with her friend [name]). Pt. Presents with significantly improved affect, makes good eye contact, smiles appropriately. Pt. 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 Pt. Reports that she continues to work and is not happy with the work environment, but has continued with changes to self-care that have allowed her a greater sense of control in her work environment. Pt. Reports that she continues to dance 3 days a week which has been helpful for her level of physical activity and socialization. Pt. Stated "we created a good boy" when discussing her son who was raised by her friend [name]. Pt. Expressed love and appreciation for her children, especially her son who is in recovery for severe bipolar disorder.  Plan: Return again in 4 weeks. Pt. To continue with CBT based therapy.  Diagnosis:Axis I: Major depressive disorder, recurrent episode, moderate and PTSD (post-traumatic stress disorder).  [name], [facility] 05/21/2016

## 2016-05-31 ENCOUNTER — Ambulatory Visit (HOSPITAL_COMMUNITY): Payer: Self-pay | Admitting: Psychiatry

## 2016-06-04 ENCOUNTER — Other Ambulatory Visit (HOSPITAL_COMMUNITY): Payer: Self-pay

## 2016-06-04 DIAGNOSIS — F329 Major depressive disorder, single episode, unspecified: Secondary | ICD-10-CM

## 2016-06-04 DIAGNOSIS — F32A Depression, unspecified: Secondary | ICD-10-CM

## 2016-06-04 DIAGNOSIS — G4700 Insomnia, unspecified: Secondary | ICD-10-CM

## 2016-06-04 MED ORDER — HYDROXYZINE HCL 10 MG PO TABS: 10.0000 mg | ORAL_TABLET | Freq: Every evening | ORAL | 0 refills | Status: DC | PRN

## 2016-06-04 MED ORDER — CELEXA 10 MG PO TABS: 20.0000 mg | ORAL_TABLET | Freq: Every day | ORAL | 2 refills | Status: DC

## 2016-06-17 ENCOUNTER — Ambulatory Visit (INDEPENDENT_AMBULATORY_CARE_PROVIDER_SITE_OTHER): Payer: Medicare Other | Admitting: Psychiatry

## 2016-06-17 DIAGNOSIS — F32A Depression, unspecified: Secondary | ICD-10-CM

## 2016-06-17 DIAGNOSIS — F329 Major depressive disorder, single episode, unspecified: Secondary | ICD-10-CM | POA: Diagnosis not present

## 2016-06-18 NOTE — Progress Notes (Signed)
   THERAPIST PROGRESS NOTE  Session Time: 3:05-4:00  Participation Level:Active  Behavioral Response:NeatAlertEuthymic  Type of Therapy: Individual Therapy  Treatment Goals addressed: Improve faulty thinking patterns, develop emotional regulation skills, self-esteem development, communication/assertiveness skills  Interventions:person-centered, rapport building, behavioral therapy  Summary: [name] is a 70 y.o. female who presents with Major depressive disorder, recurrent episode, moderate and PTSD (post-traumatic stress disorder).  Suicidal/Homicidal:No - without intent/plan  Therapist Response: Pt. Was joined with her friend [name]). Pt. Reports that work continues to be most significant stressor which was especially so since last session because a co-worker and source of support quit th job. Pt. 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 Pt. Attributes her ability to maintain wellness schedule to [place]. Session was joined by counseling intern who introduced guided visualization to help work on patterns of guilt that Pt. Reports keep her from connecting with others. Intern will work with Counselor to develop plan for developing guided visualization with an audiotape that pt. Can use between sessions.  Plan: Return again in 4 weeks. Pt. To continue with CBT based therapy.  Diagnosis:Axis I:Major depressive disorder, recurrent episode, moderate and PTSD (post-traumatic stress disorder).   [name], [facility] 06/18/2016

## 2016-07-17 ENCOUNTER — Ambulatory Visit (INDEPENDENT_AMBULATORY_CARE_PROVIDER_SITE_OTHER): Payer: Medicare Other | Admitting: Psychiatry

## 2016-07-17 DIAGNOSIS — F4310 Post-traumatic stress disorder, unspecified: Secondary | ICD-10-CM | POA: Diagnosis not present

## 2016-07-17 DIAGNOSIS — F339 Major depressive disorder, recurrent, unspecified: Secondary | ICD-10-CM | POA: Diagnosis not present

## 2016-07-22 NOTE — Progress Notes (Signed)
   THERAPIST PROGRESS NOTE  Session Time: 3:35-4:35  Participation Level:Active  Behavioral Response:NeatAlertEuthymic  Type of Therapy: Individual Therapy  Treatment Goals addressed: Improve faulty thinking patterns, develop emotional regulation skills, self-esteem development, communication/assertiveness skills  Interventions:person-centered, rapport building, behavioral therapy  Summary: [name] is a 70 y.o. female who presents with Major depressive disorder, recurrent episode, moderate and PTSD (post-traumatic stress disorder).  Suicidal/Homicidal:No - without intent/plan  Therapist Response: Pt. Was joined with her friend [name]). Pt. Participated in hypnosis session designed to address release of feelings of shame and development of empowerment in work and home environments. Pt. Reported that she felt energetic/motivated, but also relaxed at the end of the session. Recording of the session was developed so that Pt. Can practice the exercise daily for reinforcement.  Plan: Return again in 4weeks. Pt. To continue with CBT based therapy.  Diagnosis:Axis I:Major depressive disorder, recurrent episode, moderate and PTSD (post-traumatic stress disorder).  [name] B, [facility] 07/22/2016

## 2016-08-21 ENCOUNTER — Ambulatory Visit (INDEPENDENT_AMBULATORY_CARE_PROVIDER_SITE_OTHER): Payer: 59 | Admitting: Psychiatry

## 2016-08-21 DIAGNOSIS — F339 Major depressive disorder, recurrent, unspecified: Secondary | ICD-10-CM | POA: Diagnosis not present
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
## 2016-09-24 NOTE — Progress Notes (Signed)
PATIENT: Christina [name] DOB: 03-24-71-1947  REASON FOR VISIT: follow up- memory HISTORY FROM: patient  HISTORY OF PRESENT ILLNESS: Christina [name] is a 71 year old female with a history of memory disturbance. She returns today for follow-up. The patient reports that her memory has been relatively the same. She does follow up with Dr. Adele Schilder regularly. She reports that she is currently on Celexa for her depression and anxiety. She feels that this has been beneficial for her. She lives at home alone. She is able to complete all ADLs independently. Her friend is with her today. She notes that she continues to have trouble remembering how to get to certain places. However she states that these are not places that she visits weekly but rather maybe once a month. Patient denies any trouble sleeping. She states that she does not feel that she sleeps long enough. She does snore. She does report daytime sleepiness. Denies early morning headache. Her friend states that she has not witnessed any apnea events. The patient returns today for an evaluation.  HISTORY Christina [name] is a 71 year old right-handed white female with a history of a memory disturbance. The patient continues to work, she is able to manage her job relatively well. She has had no change in her activities of daily living. The patient believes that her memory has slightly changed over time however. She is having some increased problems with directions while driving. The patient is able to keep up with her medications and appointments, she requires some assistance with keeping up with financial activities. The patient does not cook. She indicates that she is sleeping fairly well, she does have some drowsiness and sleepiness during the day,  but this is not severe. The patient is not on any medications for memory, she takes ginkgo and a product called Focus Factor. She returns for an evaluation. The patient has been relatively stable on her Mini-Mental  status examinations.  REVIEW OF SYSTEMS: Out of a complete 14 system review of symptoms, the patient complains only of the following symptoms, and all other reviewed systems are negative.  Numbness, memory loss, joint pain, joint swelling, aching muscles, muscle cramps, agitation, decreased concentration, depression, nervous/anxious, snoring, leg swelling, fatigue  ALLERGIES: Allergies  Allergen Reactions  . Pork-Derived Products     Very sick on stomach  . Synthroid [Levothyroxine Sodium]     Shakes   . Valium [Diazepam]     Pt became addicted. Not an allergy    HOME MEDICATIONS: Outpatient Medications Prior to Visit  Medication Sig Dispense Refill  . Acai 500 MG CAPS Take 2 capsules by mouth daily. Reported on 12/20/2015    . aspirin 325 MG tablet Take 325 mg by mouth as needed.     Marland Kitchen atenolol (TENORMIN) 25 MG tablet Take 25 mg by mouth daily.    . beta carotene 15 MG capsule Take 15 mg by mouth daily. Reported on 12/20/2015    . CELEXA 10 MG tablet Take 2 tablets (20 mg total) by mouth daily. 60 tablet 2  . cholecalciferol (VITAMIN D) 1000 UNITS tablet Take 5,000 Units by mouth daily.     . Cyanocobalamin (B-12 IJ) Inject as directed as directed.    . folic acid (FOLVITE) [id number] MCG tablet Take 800 mcg by mouth daily. Reported on 12/20/2015    . hydrOXYzine (ATARAX/VISTARIL) 10 MG tablet Take 1 tablet (10 mg total) by mouth at bedtime as needed. 20 tablet 0  . Lactobacillus-Inulin (CULTURELLE DIGESTIVE HEALTH PO) Take 1 capsule  by mouth. Reported on 12/20/2015    . Lecithin 1200 MG CAPS Take 1 capsule by mouth daily.    . Melatonin 3 MG CAPS Take 5 capsules by mouth daily.     . Misc Natural Products (ADRENAL PO) Take 1 capsule by mouth daily.    . Multiple Vitamin (MULTIVITAMIN WITH MINERALS) TABS Take 1 tablet by mouth daily.    Marland Kitchen selenium 50 MCG TABS Take 200 mcg by mouth daily. Reported on 12/20/2015    . thiamine (VITAMIN B-1) 100 MG tablet Take 100 mg by mouth daily. Reported on  12/20/2015    . THYROID PO Take 1 capsule by mouth.    . vitamin E 400 UNIT capsule Take 400 Units by mouth daily.     No facility-administered medications prior to visit.     PAST MEDICAL HISTORY: Past Medical History:  Diagnosis Date  . Arthritis   . Blood transfusion   . Chicken pox   . Degenerative arthritis   . Depression   . Family history of thyroid problem   . HTN (hypertension)   . Irritable bowel syndrome   . Memory deficits 12/16/2013  . Positive TB test     PAST SURGICAL HISTORY: Past Surgical History:  Procedure Laterality Date  . ADENOIDECTOMY    . d [place]    . DILATION AND CURETTAGE OF UTERUS    . THYROID SURGERY  1968  . TONSILLECTOMY  1951    FAMILY HISTORY: Family History  Problem Relation Age of Onset  . Arthritis Mother   . Hyperlipidemia Mother   . Hypertension Mother   . Dementia Mother   . Arthritis Father   . Prostate cancer Father   . Hypertension Father   . Arthritis Maternal Grandmother   . Hypertension Maternal Grandmother   . Arthritis Maternal Grandfather   . Hypertension Maternal Grandfather   . Arthritis Paternal Grandmother   . Hypertension Paternal Grandmother   . Arthritis Paternal Grandfather   . Hypertension Paternal Grandfather   . Diabetes Paternal Grandfather   . Heart disease Sister     congenital heart disease  . Stroke Sister   . Dementia Sister   . Cancer - Colon Brother   . Stroke Brother   . Other Brother   . Pulmonary embolism Brother   . Other Brother   . Stroke Brother   . Drug abuse Brother   . Other Brother   . Schizophrenia Brother   . Other Brother     SOCIAL HISTORY: Social History   Social History  . Marital status: Divorced    Spouse name: N/A  . Number of children: 4  . Years of education: college 1   Occupational History  . [employer]   Social History Main Topics  . Smoking status: Never Smoker  . Smokeless tobacco: Never Used  . Alcohol use 0.0 oz/week      Comment: rare  . Drug use: No  . Sexual activity: No   Other Topics Concern  . Not on file   Social History Narrative   Patient lives at home and she is widowed.    Patient works full time.   Education one year of college.   Right handed.   Caffeine rare sweet tea.      PHYSICAL EXAM  Vitals:   09/25/16 0727  BP: (!) 147/91  Pulse: 75  Resp: 18  Weight: 170 lb (77.1 kg)  Height: 5\' 3"  (1.6 m)  Body mass index is 30.11 kg/m.   MMSE - Mini Mental State Exam 09/25/2016 12/20/2015 06/22/2015  Orientation to time 5 5 5   Orientation to Place 5 5 5   Registration 3 3 3   Attention/ Calculation 4 4 5   Recall 3 3 3   Language- name 2 objects 2 2 2   Language- repeat 1 1 1   Language- follow 3 step command 3 3 3   Language- read & follow direction 1 1 1   Write a sentence 1 1 1   Copy design 1 1 1   Total score 29 29 30      Generalized: Well developed, in no acute distress   Neurological examination  Mentation: Alert oriented to time, place, history taking. Follows all commands speech and language fluent Cranial nerve II-XII: Pupils were equal round reactive to light. Extraocular movements were full, visual field were full on confrontational test. Facial sensation and strength were normal. Uvula tongue midline. Head turning and shoulder shrug  were normal and symmetric. Motor: The motor testing reveals 5 over 5 strength of all 4 extremities. Good symmetric motor tone is noted throughout.  Sensory: Sensory testing is intact to soft touch on all 4 extremities. No evidence of extinction is noted.  Coordination: Cerebellar testing reveals good finger-nose-finger and heel-to-shin bilaterally.  Gait and station: Gait is normal. Tandem gait is normal. Romberg is negative. No drift is seen.  Reflexes: Deep tendon reflexes are symmetric and normal bilaterally.   DIAGNOSTIC DATA (LABS, IMAGING, TESTING) - I reviewed patient records, labs, notes, testing and imaging myself where  available.     ASSESSMENT AND PLAN 71 y.o. year old female  has a past medical history of Arthritis; Blood transfusion; Chicken pox; Degenerative arthritis; Depression; Family history of thyroid problem; HTN (hypertension); Irritable bowel syndrome; Memory deficits (12/16/2013); and Positive TB test. here with:  1. Memory disturbance 2. Depression 3. Daytime sleepiness 4. Snoring  The patient's memory score has remained stable. We will continue to monitor. The patient will not certainly any memory medication at this time. She is encouraged to continue following up with her psychiatrist for depression and anxiety. The patient does report daytime sleepiness as well as snoring. I have recommended a sleep evaluation however at this time the patient has deferred. Patient advised that if her symptoms worsen or she develops any new symptoms she should let us know. Will follow-up in one year or sooner if needed.     Ward Givens, MSN, NP-C 09/24/2016, 4:42 PM [facility] [address] [place], [place] [address] [phone]

## 2016-09-25 ENCOUNTER — Encounter: Payer: Self-pay | Admitting: Adult Health

## 2016-09-25 ENCOUNTER — Ambulatory Visit (INDEPENDENT_AMBULATORY_CARE_PROVIDER_SITE_OTHER): Payer: Medicare Other | Admitting: Adult Health

## 2016-09-25 ENCOUNTER — Ambulatory Visit (INDEPENDENT_AMBULATORY_CARE_PROVIDER_SITE_OTHER): Payer: 59 | Admitting: Psychiatry

## 2016-09-25 VITALS — BP 147/91 | HR 75 | Resp 18 | Ht 63.0 in | Wt 170.0 lb

## 2016-09-25 DIAGNOSIS — F339 Major depressive disorder, recurrent, unspecified: Secondary | ICD-10-CM

## 2016-09-25 DIAGNOSIS — R0683 Snoring: Secondary | ICD-10-CM | POA: Diagnosis not present

## 2016-09-25 DIAGNOSIS — F32A Depression, unspecified: Secondary | ICD-10-CM

## 2016-09-25 DIAGNOSIS — R400 Somnolence: Secondary | ICD-10-CM | POA: Diagnosis not present

## 2016-09-25 DIAGNOSIS — R413 Other amnesia: Secondary | ICD-10-CM

## 2016-09-25 DIAGNOSIS — F329 Major depressive disorder, single episode, unspecified: Secondary | ICD-10-CM

## 2016-09-25 NOTE — Patient Instructions (Signed)
Memory score is stable Will continue to monitor Consider sleep evaluation If your symptoms worsen or you develop new symptoms please let us know.

## 2016-09-25 NOTE — Progress Notes (Signed)
I have read the note, and I agree with the clinical assessment and plan.  Christina [name]   

## 2016-09-26 NOTE — Progress Notes (Signed)
   THERAPIST PROGRESS NOTE  Session Time: 3:40-4:35  Participation Level:Active  Behavioral Response:NeatAlertEuthymic  Type of Therapy: Individual Therapy  Treatment Goals addressed: Improve faulty thinking patterns, develop emotional regulation skills, self-esteem development, communication/assertiveness skills  Interventions:person-centered, rapport building, behavioral therapy  Summary: Christina [name] is a 71y.o. female who presents with Major depressive disorder, recurrent episode, moderate and PTSD (post-traumatic stress disorder).  Suicidal/Homicidal:No - without intent/plan  Therapist Response: Pt. Was joined with her friend [name]). Pt. Presents with brightened mood, relaxed, talkative. Pt. Discussed visit with neurologist and learning that assessment does not indicate dementia. Session focused on patterns of self-blame as it relates to her relationship with her daughter and granddaughter. Pt. Discussed observed her daughter's pattern of ingratitude and blames herself for her daughter's behavior. Counselor asked Pt. About her understanding of free will and how it might explain her daughter's behavior as separate from her. Counselor explored with Pt. Her feelings of guilt as they relate to her daughter's behavior and identifying the fault associated with her behavior. Pt. Was able to acknowledge that she had not taught or modeled the behavior that she sees in her daughter and therefore her fault was limited and guilt was therefore irrational.  Plan: Return again in 4weeks. Pt. To continue with CBT based therapy.  Diagnosis:Axis I:Major depressive disorder, recurrent episode, moderate and PTSD (post-traumatic stress disorder).  [name], [facility] 09/26/2016

## 2016-10-29 ENCOUNTER — Ambulatory Visit (HOSPITAL_COMMUNITY): Payer: Self-pay | Admitting: Psychiatry

## 2016-11-26 ENCOUNTER — Ambulatory Visit (INDEPENDENT_AMBULATORY_CARE_PROVIDER_SITE_OTHER): Payer: 59 | Admitting: Psychiatry

## 2016-11-26 DIAGNOSIS — F4310 Post-traumatic stress disorder, unspecified: Secondary | ICD-10-CM

## 2016-11-26 DIAGNOSIS — F339 Major depressive disorder, recurrent, unspecified: Secondary | ICD-10-CM

## 2016-11-26 DIAGNOSIS — F332 Major depressive disorder, recurrent severe without psychotic features: Secondary | ICD-10-CM | POA: Diagnosis not present
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
## 2016-12-24 ENCOUNTER — Ambulatory Visit (HOSPITAL_COMMUNITY): Payer: Self-pay | Admitting: Psychiatry

## 2016-12-24 ENCOUNTER — Ambulatory Visit (INDEPENDENT_AMBULATORY_CARE_PROVIDER_SITE_OTHER): Payer: Medicare Other | Admitting: Psychiatry

## 2016-12-24 DIAGNOSIS — F339 Major depressive disorder, recurrent, unspecified: Secondary | ICD-10-CM | POA: Diagnosis not present

## 2016-12-25 NOTE — Progress Notes (Signed)
Session Time: 3:05-4:00  Participation Level:Active  Behavioral Response:NeatAlertEuthymic  Type of Therapy: Individual Therapy  Treatment Goals addressed: Improve faulty thinking patterns, develop emotional regulation skills, self-esteem development, communication/assertiveness skills  Interventions:person-centered, rapport building, behavioral therapy  Summary: Christina [name] is a 71y.o. female who presents with Major depressive disorder, recurrent episode, moderate and PTSD (post-traumatic stress disorder).  Suicidal/Homicidal:No - without intent/plan  Therapist Response: Pt. Was joined with her friend [name]). Pt. Continues to present with bright mood. Session focused on review of positive changes that Pt. Has made in her relationship with her daughter- setting healthy boundaries and extending her self to her daughter in healthy manner; healthy boundaries in workplace- helping Pt.'s from her strengths and awareness of when she is overextended; seeking and engaging in opportunities for socialization in church and at [employer]. Pt. Was able to describe recent interaction at community dance where there was confusion with a dance partner's wife. Pt. Was able to acknowledge that she had done nothing wrong and was able not to internalize the wife's response toward her and continue to attend the dances. Counselor stated in session that Pt. Continues to engage in positive behaviors despite the reports of ongoing feelings of guilt primarily reported by [name]. Counselor discussed with [name] and Pt. That Pt. May have the feelings, but she is able to put them into perspective and move forward with the positive behaviors. Counselor stated that "removing the guilt entirely" is likely not a realistic goal given the positive strides that Pt. Has made.   Plan: Return again in 4weeks. Prepare for discharge.  Diagnosis:Axis I:Major depressive disorder, recurrent episode, moderate and  PTSD (post-traumatic stress disorder).

## 2017-01-12 ENCOUNTER — Other Ambulatory Visit (HOSPITAL_COMMUNITY): Payer: Self-pay | Admitting: Psychiatry

## 2017-01-12 DIAGNOSIS — F329 Major depressive disorder, single episode, unspecified: Secondary | ICD-10-CM

## 2017-01-12 DIAGNOSIS — F32A Depression, unspecified: Secondary | ICD-10-CM

## 2017-01-17 ENCOUNTER — Telehealth (HOSPITAL_COMMUNITY): Payer: Self-pay

## 2017-01-17 MED ORDER — CELEXA 10 MG PO TABS: 10.0000 mg | ORAL_TABLET | Freq: Every day | ORAL | 0 refills | Status: DC

## 2017-01-17 NOTE — Telephone Encounter (Signed)
Patients friend [name] called and said that patient needs a refill on her Celexa. I noticed that we had not filled it since September. I asked when she ran out and how had she been taking. [name] did not know so I called patient at work, she admitted to me that she had been out for 2 weeks and before that was spacing them out because she did not want to run out. I expressed the importance of following up with you (she was last here in June 2017) and taking her medication properly. She did not think she could get a refill when you were out and I explained that there is always someone covering. She has an appointment with you on 5/3 - do you want to refill?

## 2017-01-17 NOTE — Telephone Encounter (Signed)
Restart Celexa 10 mg until I will see her on her next appointment.

## 2017-01-23 ENCOUNTER — Ambulatory Visit (INDEPENDENT_AMBULATORY_CARE_PROVIDER_SITE_OTHER): Payer: Medicare Other | Admitting: Psychiatry

## 2017-01-23 ENCOUNTER — Encounter (HOSPITAL_COMMUNITY): Payer: Self-pay | Admitting: Psychiatry

## 2017-01-23 VITALS — BP 156/98 | HR 72 | Ht 63.5 in | Wt 172.0 lb

## 2017-01-23 DIAGNOSIS — Z79899 Other long term (current) drug therapy: Secondary | ICD-10-CM | POA: Diagnosis not present

## 2017-01-23 DIAGNOSIS — F339 Major depressive disorder, recurrent, unspecified: Secondary | ICD-10-CM | POA: Diagnosis not present

## 2017-01-23 DIAGNOSIS — F4310 Post-traumatic stress disorder, unspecified: Secondary | ICD-10-CM | POA: Diagnosis not present

## 2017-01-23 DIAGNOSIS — Z810 Family history of intellectual disabilities: Secondary | ICD-10-CM | POA: Diagnosis not present

## 2017-01-23 DIAGNOSIS — Z818 Family history of other mental and behavioral disorders: Secondary | ICD-10-CM | POA: Diagnosis not present

## 2017-01-23 MED ORDER — CELEXA 10 MG PO TABS: ORAL_TABLET | ORAL | 2 refills | Status: DC
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
## 2017-02-03 ENCOUNTER — Ambulatory Visit (HOSPITAL_COMMUNITY): Payer: Self-pay | Admitting: Psychiatry

## 2017-02-13 ENCOUNTER — Other Ambulatory Visit (HOSPITAL_COMMUNITY): Payer: Self-pay

## 2017-03-06 ENCOUNTER — Other Ambulatory Visit (HOSPITAL_COMMUNITY): Payer: Self-pay | Admitting: Psychiatry

## 2017-03-06 ENCOUNTER — Telehealth (HOSPITAL_COMMUNITY): Payer: Self-pay

## 2017-03-06 DIAGNOSIS — F339 Major depressive disorder, recurrent, unspecified: Secondary | ICD-10-CM

## 2017-03-06 NOTE — Telephone Encounter (Signed)
Medication refill request - second fax requesting a new order for patient's prescribed Celexa be sent to [facility].  Last ordered 01/23/17 + 2 refills and patient returns 04/24/17.

## 2017-03-10 ENCOUNTER — Other Ambulatory Visit (HOSPITAL_COMMUNITY): Payer: Self-pay | Admitting: Psychiatry

## 2017-03-10 DIAGNOSIS — F339 Major depressive disorder, recurrent, unspecified: Secondary | ICD-10-CM

## 2017-03-10 NOTE — Telephone Encounter (Signed)
Send refill request to [facility] until she had appointment to see me.

## 2017-03-11 NOTE — Telephone Encounter (Signed)
The prescription was written in May with 2 refills, patient has enough medication to get to her appointment

## 2017-03-24 ENCOUNTER — Ambulatory Visit (HOSPITAL_COMMUNITY): Payer: Self-pay | Admitting: Psychiatry

## 2017-03-31 ENCOUNTER — Other Ambulatory Visit (HOSPITAL_COMMUNITY): Payer: Self-pay

## 2017-03-31 DIAGNOSIS — F339 Major depressive disorder, recurrent, unspecified: Secondary | ICD-10-CM

## 2017-03-31 MED ORDER — CELEXA 10 MG PO TABS: ORAL_TABLET | ORAL | 0 refills | Status: DC

## 2017-03-31 NOTE — Progress Notes (Signed)
Patient called to let me know that she has switched to [facility] and she needed a 90 day rx sent to them because [facility] would not fill anymore refills since she switched. I called and confirmed this and then sent a 90 day order to [facility], they will have it to patient by the end of the week.

## 2017-04-24 ENCOUNTER — Ambulatory Visit (INDEPENDENT_AMBULATORY_CARE_PROVIDER_SITE_OTHER): Payer: Medicare Other | Admitting: Psychiatry

## 2017-04-24 ENCOUNTER — Encounter (HOSPITAL_COMMUNITY): Payer: Self-pay | Admitting: Psychiatry

## 2017-04-24 DIAGNOSIS — Z818 Family history of other mental and behavioral disorders: Secondary | ICD-10-CM

## 2017-04-24 DIAGNOSIS — Z810 Family history of intellectual disabilities: Secondary | ICD-10-CM | POA: Diagnosis not present

## 2017-04-24 DIAGNOSIS — F339 Major depressive disorder, recurrent, unspecified: Secondary | ICD-10-CM | POA: Diagnosis not present

## 2017-04-24 DIAGNOSIS — F4310 Post-traumatic stress disorder, unspecified: Secondary | ICD-10-CM | POA: Diagnosis not present
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
## 2017-04-28 ENCOUNTER — Ambulatory Visit (INDEPENDENT_AMBULATORY_CARE_PROVIDER_SITE_OTHER): Payer: Medicare Other | Admitting: Psychiatry

## 2017-04-28 DIAGNOSIS — F339 Major depressive disorder, recurrent, unspecified: Secondary | ICD-10-CM | POA: Diagnosis not present
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
## 2017-05-19 ENCOUNTER — Other Ambulatory Visit (HOSPITAL_COMMUNITY): Payer: Self-pay | Admitting: Psychiatry

## 2017-05-19 DIAGNOSIS — F339 Major depressive disorder, recurrent, unspecified: Secondary | ICD-10-CM

## 2017-05-21 ENCOUNTER — Telehealth (HOSPITAL_COMMUNITY): Payer: Self-pay

## 2017-05-21 NOTE — Telephone Encounter (Signed)
Medication refill request - Fax refill request from [facility] for patient's prescribed Celexa, last prescribed 03/31/17 for 90 days with 0 refills and pt. does not return until 11/01/17.

## 2017-05-22 ENCOUNTER — Other Ambulatory Visit (HOSPITAL_COMMUNITY): Payer: Self-pay | Admitting: Psychiatry

## 2017-05-22 NOTE — Telephone Encounter (Signed)
Too soon to refill.  She should have enough until last week of October

## 2017-06-05 ENCOUNTER — Other Ambulatory Visit (HOSPITAL_COMMUNITY): Payer: Self-pay | Admitting: Psychiatry

## 2017-06-26 ENCOUNTER — Ambulatory Visit (INDEPENDENT_AMBULATORY_CARE_PROVIDER_SITE_OTHER): Payer: Medicare Other | Admitting: Psychiatry

## 2017-06-26 DIAGNOSIS — F339 Major depressive disorder, recurrent, unspecified: Secondary | ICD-10-CM
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
## 2017-08-05 ENCOUNTER — Encounter (HOSPITAL_COMMUNITY): Payer: Self-pay | Admitting: Licensed Clinical Social Worker

## 2017-08-05 ENCOUNTER — Ambulatory Visit (INDEPENDENT_AMBULATORY_CARE_PROVIDER_SITE_OTHER): Payer: Medicare Other | Admitting: Licensed Clinical Social Worker

## 2017-08-05 DIAGNOSIS — F331 Major depressive disorder, recurrent, moderate: Secondary | ICD-10-CM
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
## 2017-09-29 ENCOUNTER — Encounter: Payer: Self-pay | Admitting: Adult Health

## 2017-09-29 ENCOUNTER — Ambulatory Visit: Payer: Medicare Other | Admitting: Adult Health

## 2017-09-29 VITALS — BP 159/98 | HR 65 | Wt 170.0 lb

## 2017-09-29 DIAGNOSIS — R413 Other amnesia: Secondary | ICD-10-CM

## 2017-09-29 NOTE — Progress Notes (Signed)
PATIENT: Christina [name] DOB: 23-Jul-1946  REASON FOR VISIT: follow up- memory HISTORY FROM: patient  HISTORY OF PRESENT ILLNESS: Today 09/29/17 Christina [name] Is a 73 year old female with a history of memory disturbance.  She returns today for follow-up.  She states that her memory Is up and down.  She reports that she is not noticed any significant changes.  She is able to complete all ADLs independently.  She operates a Teacher, music without difficulty.  She has a friend that cooks for her.  She states that this is only because she is a better cook.  She lives at home alone.  She reports that she does have some issues managing her finances such as missed Payments. She states that this is an ongoing issue and does not feel that it is reflective of her memory. She returns today for an evaluation.    HISTORY Christina [name] is a 72 year old female with a history of memory disturbance. She returns today for follow-up. The patient reports that her memory has been relatively the same. She does follow up with Dr. Adele Schilder regularly. She reports that she is currently on Celexa for her depression and anxiety. She feels that this has been beneficial for her. She lives at home alone. She is able to complete all ADLs independently. Her friend is with her today. She notes that she continues to have trouble remembering how to get to certain places. However she states that these are not places that she visits weekly but rather maybe once a month. Patient denies any trouble sleeping. She states that she does not feel that she sleeps long enough. She does snore. She does report daytime sleepiness. Denies early morning headache. Her friend states that she has not witnessed any apnea events. The patient returns today for an evaluation.  REVIEW OF SYSTEMS: Out of a complete 14 system review of symptoms, the patient complains only of the following symptoms, and all other reviewed systems are negative.  See  HPI  ALLERGIES: Allergies  Allergen Reactions  . Peanut-Containing Drug Products Palpitations  . Pork-Derived Products     Very sick on stomach  . Synthroid [Levothyroxine Sodium]     Shakes   . Valium [Diazepam]     Pt became addicted. Not an allergy    HOME MEDICATIONS: Outpatient Medications Prior to Visit  Medication Sig Dispense Refill  . Acai 500 MG CAPS Take 2 capsules by mouth daily. Reported on 12/20/2015    . aspirin 325 MG tablet Take 325 mg by mouth as needed.     . beta carotene 15 MG capsule Take 15 mg by mouth daily. Reported on 12/20/2015    . CELEXA 10 MG tablet Take 1 tab twice daily 180 tablet 0  . cholecalciferol (VITAMIN D) 1000 UNITS tablet Take 5,000 Units by mouth daily.     . Cyanocobalamin (B-12 IJ) Inject as directed as directed.    . folic acid (FOLVITE) 242 MCG tablet Take 800 mcg by mouth daily. Reported on 12/20/2015    . irbesartan (AVAPRO) 150 MG tablet TK 1 T PO QD  0  . Lactobacillus-Inulin (CULTURELLE DIGESTIVE HEALTH PO) Take 1 capsule by mouth. Reported on 12/20/2015    . Lecithin 1200 MG CAPS Take 1 capsule by mouth daily.    . Melatonin 3 MG CAPS Take 5 capsules by mouth daily.     . metoprolol succinate (TOPROL-XL) 25 MG 24 hr tablet TK 1 T PO  QD  6  . Misc  Natural Products (ADRENAL PO) Take 1 capsule by mouth daily.    Marland Kitchen thiamine (VITAMIN B-1) 100 MG tablet Take 100 mg by mouth daily. Reported on 12/20/2015    . THYROID PO Take 1 capsule by mouth.    . vitamin A 10000 UNIT capsule Take 10,000 Units by mouth daily.    . vitamin E 400 UNIT capsule Take 400 Units by mouth daily.     No facility-administered medications prior to visit.     PAST MEDICAL HISTORY: Past Medical History:  Diagnosis Date  . Arthritis   . Blood transfusion   . Chicken pox   . Degenerative arthritis   . Depression   . Family history of thyroid problem   . HTN (hypertension)   . Irritable bowel syndrome   . Memory deficits 12/16/2013  . Positive TB test      PAST SURGICAL HISTORY: Past Surgical History:  Procedure Laterality Date  . ADENOIDECTOMY    . d [place]    . DILATION AND CURETTAGE OF UTERUS    . THYROID SURGERY  1968  . TONSILLECTOMY  1951    FAMILY HISTORY: Family History  Problem Relation Age of Onset  . Arthritis Mother   . Hyperlipidemia Mother   . Hypertension Mother   . Dementia Mother   . Arthritis Father   . Prostate cancer Father   . Hypertension Father   . Heart disease Sister        congenital heart disease  . Stroke Sister   . Dementia Sister   . Cancer - Colon Brother   . Stroke Brother   . Other Brother   . Pulmonary embolism Brother   . Other Brother   . Stroke Brother   . Drug abuse Brother   . Other Brother   . Schizophrenia Brother   . Other Brother   . Arthritis Maternal Grandmother   . Hypertension Maternal Grandmother   . Arthritis Maternal Grandfather   . Hypertension Maternal Grandfather   . Arthritis Paternal Grandmother   . Hypertension Paternal Grandmother   . Arthritis Paternal Grandfather   . Hypertension Paternal Grandfather   . Diabetes Paternal Grandfather     SOCIAL HISTORY: Social History   Socioeconomic History  . Marital status: Divorced    Spouse name: Not on file  . Number of children: 4  . Years of education: college 1  . Highest education level: Not on file  Social Needs  . Financial resource strain: Not on file  . Food insecurity - worry: Not on file  . Food insecurity - inability: Not on file  . Transportation needs - medical: Not on file  . Transportation needs - non-medical: Not on file  Occupational History  . Occupation: FLOATER    Employer: [employer]  Tobacco Use  . Smoking status: Never Smoker  . Smokeless tobacco: Never Used  Substance and Sexual Activity  . Alcohol use: No    Alcohol/week: 0.0 oz    Comment: rare  . Drug use: No  . Sexual activity: No  Other Topics Concern  . Not on file  Social History Narrative    Patient lives at home and she is widowed.    Patient works full time.   Education one year of college.   Right handed.   Caffeine rare sweet tea.      PHYSICAL EXAM  Vitals:   09/29/17 0926  BP: (!) 159/98  Pulse: 65  Weight: 170 lb (77.1 kg)  Body mass index is 30.11 kg/m.   MMSE - Mini Mental State Exam 09/29/2017 09/25/2016 12/20/2015  Orientation to time 5 5 5   Orientation to Place 5 5 5   Registration 3 3 3   Attention/ Calculation 5 4 4   Recall 2 3 3   Language- name 2 objects 2 2 2   Language- repeat 1 1 1   Language- follow 3 step command 3 3 3   Language- read & follow direction 1 1 1   Write a sentence 1 1 1   Copy design 0 1 1  Total score 28 29 29      Generalized: Well developed, in no acute distress   Neurological examination  Mentation: Alert oriented to time, place, history taking. Follows all commands speech and language fluent Cranial nerve II-XII: Pupils were equal round reactive to light. Extraocular movements were full, visual field were full on confrontational test. Facial sensation and strength were normal. Uvula tongue midline. Head turning and shoulder shrug  were normal and symmetric. Motor: The motor testing reveals 5 over 5 strength of all 4 extremities. Good symmetric motor tone is noted throughout.  Sensory: Sensory testing is intact to soft touch on all 4 extremities. No evidence of extinction is noted.  Coordination: Cerebellar testing reveals good finger-nose-finger and heel-to-shin bilaterally.  Gait and station: Gait is normal. Tandem gait is normal. Romberg is negative. No drift is seen.  Reflexes: Deep tendon reflexes are symmetric and normal bilaterally.   DIAGNOSTIC DATA (LABS, IMAGING, TESTING) - I reviewed patient records, labs, notes, testing and imaging myself where available.      ASSESSMENT AND PLAN 72 y.o. year old female  has a past medical history of Arthritis, Blood transfusion, Chicken pox, Degenerative arthritis, Depression,  Family history of thyroid problem, HTN (hypertension), Irritable bowel syndrome, Memory deficits (12/16/2013), and Positive TB test. here with:  1. Memory disturbance  Memory score is stable. We will continue to monitor. She will keep follow-up appointments with psychiatrist. She was advised that if her symptoms worsen or she develops new symptoms then she should let us know.  I spent 15 minutes with the patient. 50% of this time was spent reviewing memory score.     Ward Givens, MSN, NP-C 09/29/2017, 9:40 AM [facility] [address] [place], [place] [address] [phone]

## 2017-09-29 NOTE — Progress Notes (Signed)
I have read the note, and I agree with the clinical assessment and plan.  Beren Yniguez K [place]   

## 2017-09-29 NOTE — Patient Instructions (Signed)
Your Plan:  Continue to monitor memory If your symptoms worsen or you develop new symptoms please let us know.    Thank you for coming to see us at [facility]. I hope we have been able to provide you high quality care today.  You may receive a patient satisfaction survey over the next few weeks. We would appreciate your feedback and comments so that we may continue to improve ourselves and the health of our patients.  

## 2017-10-02 ENCOUNTER — Ambulatory Visit (HOSPITAL_COMMUNITY): Payer: Medicare Other | Admitting: Licensed Clinical Social Worker

## 2017-10-23 ENCOUNTER — Ambulatory Visit (HOSPITAL_COMMUNITY): Payer: Medicare Other | Admitting: Licensed Clinical Social Worker

## 2017-10-28 ENCOUNTER — Encounter (HOSPITAL_COMMUNITY): Payer: Self-pay | Admitting: Psychiatry

## 2017-10-28 ENCOUNTER — Ambulatory Visit (INDEPENDENT_AMBULATORY_CARE_PROVIDER_SITE_OTHER): Payer: Medicare Other | Admitting: Psychiatry

## 2017-10-28 DIAGNOSIS — Z91410 Personal history of adult physical and sexual abuse: Secondary | ICD-10-CM

## 2017-10-28 DIAGNOSIS — Z91411 Personal history of adult psychological abuse: Secondary | ICD-10-CM

## 2017-10-28 DIAGNOSIS — F4310 Post-traumatic stress disorder, unspecified: Secondary | ICD-10-CM | POA: Diagnosis not present

## 2017-10-28 DIAGNOSIS — Z810 Family history of intellectual disabilities: Secondary | ICD-10-CM

## 2017-10-28 DIAGNOSIS — Z813 Family history of other psychoactive substance abuse and dependence: Secondary | ICD-10-CM | POA: Diagnosis not present

## 2017-10-28 DIAGNOSIS — F339 Major depressive disorder, recurrent, unspecified: Secondary | ICD-10-CM | POA: Diagnosis not present

## 2017-10-28 DIAGNOSIS — Z818 Family history of other mental and behavioral disorders: Secondary | ICD-10-CM | POA: Diagnosis not present

## 2017-10-28 MED ORDER — CELEXA 10 MG PO TABS: ORAL_TABLET | ORAL | 0 refills | Status: DC
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
## 2017-11-17 ENCOUNTER — Telehealth (HOSPITAL_COMMUNITY): Payer: Self-pay

## 2017-11-17 NOTE — Telephone Encounter (Signed)
Patient called saying that she has been waiting on her Celexa to be approved for several weeks, and she is requesting a call. [phone]

## 2017-12-16 ENCOUNTER — Encounter (HOSPITAL_COMMUNITY): Payer: Self-pay | Admitting: Licensed Clinical Social Worker

## 2017-12-16 ENCOUNTER — Ambulatory Visit (HOSPITAL_COMMUNITY): Payer: Medicare Other | Admitting: Licensed Clinical Social Worker

## 2017-12-16 DIAGNOSIS — F331 Major depressive disorder, recurrent, moderate: Secondary | ICD-10-CM
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
## 2018-01-14 ENCOUNTER — Telehealth (HOSPITAL_COMMUNITY): Payer: Self-pay

## 2018-01-14 NOTE — Telephone Encounter (Signed)
Medication management - Telephone call with Sherry Ruffing to follow up on concern patient's namebrand Celexa 10 mg, 2 a day had not been filled or approved yet.  Informed Dennie Maizes, CMA had already called [facility] earlier today and patient's medication was approved on 11/25/17 through until 09/22/18 for the cost of $325 a month as CMA confirmed when they attempted to run medication for filling.  Ms. Edmonia Lynch agreed to call [facility] to verify the medication was approved for this cost and was being filled to be shipped to her as was okay with new price for name brand formula.   Patient or collateral to call back if any problems getting medication filled.

## 2018-01-27 ENCOUNTER — Ambulatory Visit (INDEPENDENT_AMBULATORY_CARE_PROVIDER_SITE_OTHER): Payer: Medicare Other | Admitting: Psychiatry

## 2018-01-27 ENCOUNTER — Encounter (HOSPITAL_COMMUNITY): Payer: Self-pay | Admitting: Psychiatry

## 2018-01-27 DIAGNOSIS — F339 Major depressive disorder, recurrent, unspecified: Secondary | ICD-10-CM | POA: Diagnosis not present

## 2018-01-27 DIAGNOSIS — Z818 Family history of other mental and behavioral disorders: Secondary | ICD-10-CM

## 2018-01-27 DIAGNOSIS — F419 Anxiety disorder, unspecified: Secondary | ICD-10-CM | POA: Diagnosis not present

## 2018-01-27 DIAGNOSIS — Z813 Family history of other psychoactive substance abuse and dependence: Secondary | ICD-10-CM | POA: Diagnosis not present

## 2018-01-27 DIAGNOSIS — Z810 Family history of intellectual disabilities: Secondary | ICD-10-CM

## 2018-01-27 DIAGNOSIS — Z5689 Other problems related to employment: Secondary | ICD-10-CM | POA: Diagnosis not present

## 2018-01-27 MED ORDER — CELEXA 10 MG PO TABS
ORAL_TABLET | ORAL | 0 refills | Status: DC
Start: 2018-01-27 — End: 2018-08-10
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
## 2018-02-03 ENCOUNTER — Ambulatory Visit (INDEPENDENT_AMBULATORY_CARE_PROVIDER_SITE_OTHER): Payer: Medicare Other | Admitting: Licensed Clinical Social Worker

## 2018-02-03 ENCOUNTER — Encounter (HOSPITAL_COMMUNITY): Payer: Self-pay | Admitting: Licensed Clinical Social Worker

## 2018-02-03 DIAGNOSIS — F331 Major depressive disorder, recurrent, moderate: Secondary | ICD-10-CM | POA: Diagnosis not present

## 2018-02-03 NOTE — Progress Notes (Signed)
   THERAPIST PROGRESS NOTE  Session Time: 4:30pm-5:30pm  Participation Level: Active  Behavioral Response: Well GroomedAlertDepressed  Type of Therapy: Individual Therapy  Treatment Goals addressed: Improve Psychiatric Symptoms, elevate mood (increased self-esteem, increased self-compassion, increased interaction), improve unhelpful thought patterns, controlled behavior, moderate mood, deliberate speech and thought process(improved social functioning, healthy adjustment to living situation), Learn about diagnosis, healthy coping skills  Interventions: Motivational Interviewing, CBT, Grounding & Mindfulness Techniques, psychoeducation  Summary: Christina [name] is a 72 y.o. female who presents with Major Depressive Disorder, recurrent episode, moderate.  Suicidal/Homicidal: No - without intent/plan  Therapist Response: [name] met with clinician for an individual session.  Robert discussed her psychiatric symptoms, her current life events and her homework. [name] shared improvement in depression and anxiety sxs due to having Celexa refilled. She identified improvement in resting mood, as well as sleep. However, she reports some tiredness about 2 hours after taking the medication. [name] reports she has been less edgy and irritable. Clinician explored interactions at work and ability to cope with stress/chaos. Clinician discussed upbringing and hx of chaos at home as a child and young adult. [name] processed experiences with mother. [name] also discussed her feelings of guilt and responsibility for the many losses in her life. Clinician provided psychoeducation about CBT and the connections between thoughts, feelings, and behavior. Clinician noted core belief of being unlovable, as well as the fear of loving something that will eventually be lost. [name] identified early belief that if she loved something, it would die. She also processed a late term miscarriage that occurred at the 9th month of gestation.    Plan: Return again in 4 weeks  Diagnosis:     Axis I: Major Depressive Disorder, recurrent episode, moderate                            [name], LCSW 02/03/2018

## 2018-03-17 ENCOUNTER — Ambulatory Visit (HOSPITAL_COMMUNITY): Payer: Medicare Other | Admitting: Licensed Clinical Social Worker

## 2018-04-15 ENCOUNTER — Ambulatory Visit (HOSPITAL_COMMUNITY): Payer: Medicare Other | Admitting: Licensed Clinical Social Worker

## 2018-04-30 ENCOUNTER — Ambulatory Visit (HOSPITAL_COMMUNITY): Payer: Self-pay | Admitting: Psychiatry

## 2018-07-15 ENCOUNTER — Ambulatory Visit (HOSPITAL_COMMUNITY): Payer: Self-pay | Admitting: Licensed Clinical Social Worker

## 2018-07-15 ENCOUNTER — Ambulatory Visit (HOSPITAL_COMMUNITY): Payer: Medicare Other | Admitting: Licensed Clinical Social Worker

## 2018-07-15 ENCOUNTER — Encounter (HOSPITAL_COMMUNITY): Payer: Self-pay | Admitting: Licensed Clinical Social Worker

## 2018-07-15 DIAGNOSIS — F339 Major depressive disorder, recurrent, unspecified: Secondary | ICD-10-CM | POA: Diagnosis not present
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
## 2018-08-10 ENCOUNTER — Ambulatory Visit (INDEPENDENT_AMBULATORY_CARE_PROVIDER_SITE_OTHER): Payer: Medicare Other | Admitting: Psychiatry

## 2018-08-10 VITALS — BP 142/80 | Ht 63.0 in | Wt 163.0 lb

## 2018-08-10 DIAGNOSIS — F411 Generalized anxiety disorder: Secondary | ICD-10-CM | POA: Diagnosis not present

## 2018-08-10 DIAGNOSIS — F339 Major depressive disorder, recurrent, unspecified: Secondary | ICD-10-CM

## 2018-08-10 MED ORDER — CELEXA 10 MG PO TABS: ORAL_TABLET | ORAL | 0 refills | Status: DC
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
## 2018-09-07 ENCOUNTER — Encounter (HOSPITAL_COMMUNITY): Payer: Self-pay | Admitting: Licensed Clinical Social Worker

## 2018-09-07 ENCOUNTER — Ambulatory Visit (HOSPITAL_COMMUNITY): Payer: Medicare Other | Admitting: Licensed Clinical Social Worker

## 2018-09-07 DIAGNOSIS — F330 Major depressive disorder, recurrent, mild: Secondary | ICD-10-CM

## 2018-09-07 NOTE — Progress Notes (Signed)
   THERAPIST PROGRESS NOTE  Session Time: 2:30pm-3:20pm  Participation Level: Active  Behavioral Response: NeatAlertEuthymic  Type of Therapy: Individual Therapy  Treatment Goals addressed: Improve Psychiatric Symptoms, elevate mood (increased self-esteem, increased self-compassion, increased interaction), improve unhelpful thought patterns, controlled behavior, moderate mood, deliberate speech and thought process(improved social functioning, healthy adjustment to living situation), Learn about diagnosis, healthy coping skills  Interventions: Motivational Interviewing, CBT, Grounding & Mindfulness Techniques, psychoeducation  Summary: Christina [name] is a 72 y.o. female who presents with Major Depressive Disorder, recurrent episode, mild  Suicidal/Homicidal: No - without intent/plan  Therapist Response: Andrea and friend [name] met with clinician for an individual session.  [name] discussed her psychiatric symptoms, her current life events and her homework. [name] shared concerns about her daughter and granddaughter. She processed her worries and noted challenges with granddaughter being able to access supportive adults. Clinician provided feedback using CBT psychoeducation about how core beliefs are created in early childhood and how we attempt to prove those core beliefs true through our interactions with others.  Clinician discussed the importance of Bryahna and [name]'s involvement with granddaughter and daughter. Clinician identified other helpful adults that were present in each of their lives growing up and that they were instrumental in their ability to maintain and survive.   Plan: Return again in 4 weeks  Diagnosis:     Axis I: Major Depressive Disorder, recurrent episode, mild  [name], LCSW 09/07/2018

## 2018-09-25 ENCOUNTER — Encounter: Payer: Self-pay | Admitting: Adult Health

## 2018-10-01 ENCOUNTER — Ambulatory Visit: Payer: Medicare Other | Admitting: Adult Health

## 2018-10-08 ENCOUNTER — Ambulatory Visit (HOSPITAL_COMMUNITY): Payer: Medicare Other | Admitting: Licensed Clinical Social Worker

## 2018-11-09 ENCOUNTER — Ambulatory Visit (HOSPITAL_COMMUNITY): Payer: Medicare Other | Admitting: Psychiatry

## 2018-11-16 ENCOUNTER — Ambulatory Visit (HOSPITAL_COMMUNITY): Payer: Medicare Other | Admitting: Licensed Clinical Social Worker

## 2018-11-16 ENCOUNTER — Encounter (HOSPITAL_COMMUNITY): Payer: Self-pay | Admitting: Licensed Clinical Social Worker

## 2018-11-16 DIAGNOSIS — F330 Major depressive disorder, recurrent, mild: Secondary | ICD-10-CM | POA: Diagnosis not present

## 2018-11-16 NOTE — Progress Notes (Signed)
   THERAPIST PROGRESS NOTE  Session Time: 9:00am-9:50am  Participation Level: Active  Behavioral Response: Well GroomedAlertEuthymic  Type of Therapy: Individual Therapy  Treatment Goals addressed: Improve Psychiatric Symptoms, elevate mood (increased self-esteem, increased self-compassion, increased interaction), improve unhelpful thought patterns, controlled behavior, moderate mood, deliberate speech and thought process(improved social functioning, healthy adjustment to living situation), Learn about diagnosis, healthy coping skills  Interventions: Motivational Interviewing, CBT, Grounding & Mindfulness Techniques, psychoeducation  Summary: Christina [name] is a 73 y.o. female who presents with Major Depressive Disorder, recurrent episode, mild.  Suicidal/Homicidal: No - without intent/plan  Therapist Response: Christina [name] met with clinician for an individual session. Her friend Christina [name] was with her for support and collateral information.  Christina [name] discussed her psychiatric symptoms, her current life events and her homework. Christina [name] shared that overall she has been doing okay. She reports a great deal of stress at work, which makes her feel very exhausted when she gets home. She identified some increased anger and irritability, especially with not being able to find things, or when things have built up. Clinician discussed ways to increase self care, taking some days off, socializing, etc. Christina [name] provided feedback about Christina [name]'s mood and the state of her home. Christina [name] processed frustrations at work and noted that she can handle these things for a long time due to her childhood. Clinician explored some details about relationships with parents and processed interactions with mother in particular.   Clinician discussed need for ongoing therapy. Christina [name] reports she would like to check in every 3 months to ensure availability and stability, as well as to remain on her medication.  Plan: Return again in 3 months  Diagnosis:      Axis I: Major Depressive Disorder, recurrent episode, mild   [name], LCSW 11/16/2018
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
## 2018-11-23 ENCOUNTER — Ambulatory Visit: Payer: Medicare Other | Admitting: Neurology

## 2018-11-23 ENCOUNTER — Encounter: Payer: Self-pay | Admitting: Neurology

## 2018-11-23 VITALS — BP 167/104 | HR 71 | Ht 63.0 in | Wt 167.0 lb

## 2018-11-23 DIAGNOSIS — R413 Other amnesia: Secondary | ICD-10-CM | POA: Diagnosis not present

## 2018-11-24 NOTE — Progress Notes (Signed)
I have read the note, and I agree with the clinical assessment and plan.  Charles K Willis   

## 2018-12-03 ENCOUNTER — Ambulatory Visit (HOSPITAL_COMMUNITY): Payer: Medicare Other | Admitting: Psychiatry

## 2018-12-14 ENCOUNTER — Other Ambulatory Visit: Payer: Self-pay

## 2018-12-14 ENCOUNTER — Encounter (HOSPITAL_COMMUNITY): Payer: Self-pay | Admitting: Psychiatry

## 2018-12-14 ENCOUNTER — Ambulatory Visit (INDEPENDENT_AMBULATORY_CARE_PROVIDER_SITE_OTHER): Payer: Medicare Other | Admitting: Psychiatry

## 2018-12-14 VITALS — BP 158/107 | HR 74 | Temp 98.3°F | Ht 63.0 in | Wt 168.0 lb

## 2018-12-14 DIAGNOSIS — F411 Generalized anxiety disorder: Secondary | ICD-10-CM | POA: Diagnosis not present

## 2018-12-14 DIAGNOSIS — F339 Major depressive disorder, recurrent, unspecified: Secondary | ICD-10-CM

## 2018-12-14 DIAGNOSIS — Z818 Family history of other mental and behavioral disorders: Secondary | ICD-10-CM | POA: Diagnosis not present

## 2018-12-14 DIAGNOSIS — Z813 Family history of other psychoactive substance abuse and dependence: Secondary | ICD-10-CM | POA: Diagnosis not present

## 2018-12-14 MED ORDER — CELEXA 10 MG PO TABS: 10.0000 mg | ORAL_TABLET | Freq: Two times a day (BID) | ORAL | 0 refills | Status: DC
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
## 2019-02-16 ENCOUNTER — Ambulatory Visit (INDEPENDENT_AMBULATORY_CARE_PROVIDER_SITE_OTHER): Payer: Medicare Other | Admitting: Licensed Clinical Social Worker

## 2019-02-16 ENCOUNTER — Encounter (HOSPITAL_COMMUNITY): Payer: Self-pay | Admitting: Licensed Clinical Social Worker

## 2019-02-16 ENCOUNTER — Other Ambulatory Visit: Payer: Self-pay

## 2019-02-16 DIAGNOSIS — F330 Major depressive disorder, recurrent, mild: Secondary | ICD-10-CM | POA: Diagnosis not present

## 2019-02-16 NOTE — Progress Notes (Signed)
Virtual Visit via Telephone Note  I connected with Angles Luellen on 02/16/19 at  2:30 PM EDT by telephone and verified that I am speaking with the correct person using two identifiers.  I discussed the limitations, risks, security and privacy concerns of performing an evaluation and management service by telephone and the availability of in person appointments. I also discussed with the patient that there may be a patient responsible charge related to this service. The patient expressed understanding and agreed to proceed.  Type of Therapy: Individual Therapy  Treatment Goals addressed: Improve Psychiatric Symptoms, elevate mood (increased self-esteem, increased self-compassion, increased interaction), improve unhelpful thought patterns, controlled behavior, moderate mood, deliberate speech and thought process(improved social functioning, healthy adjustment to living situation), Learn about diagnosis, healthy coping skills  Interventions: Motivational Interviewing, CBT, Grounding & Mindfulness Techniques, psychoeducation  Summary: Senita Corredor is a 73 y.o. female who presents with Major Depressive Disorder, recurrent episode, mild  Suicidal/Homicidal: No - without intent/plan  Therapist Response:  [name] met with clinician for an individual session. Avyn discussed her psychiatric symptoms, her current life events and her homework. Liza shared that she has been doing well over the quarantine period. 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 Clinician explored mood and interactions with others. Preeti reports she has been trying to go with the flow, not having any anxiety or depression sxs. Brennen reports that one positive thing that has come out of this experience is increased contact from her children, who live in [place], [place], and [place]. Danija reports everyone is safe and  healthy, and they are checking in on her a few times per week.    Plan: Return again in 3 months  Diagnosis: Axis I: Major Depressive Disorder, recurrent episode, mild    I discussed the assessment and treatment plan with the patient. The patient was provided an opportunity to ask questions and all were answered. The patient agreed with the plan and demonstrated an understanding of the instructions.   The patient was advised to call back or seek an in-person evaluation if the symptoms worsen or if the condition fails to improve as anticipated.  I provided 40 minutes of non-face-to-face time during this encounter.   [name], LCSW

## 2019-03-13 ENCOUNTER — Other Ambulatory Visit (HOSPITAL_COMMUNITY): Payer: Self-pay | Admitting: Psychiatry

## 2019-03-13 DIAGNOSIS — F411 Generalized anxiety disorder: Secondary | ICD-10-CM

## 2019-03-13 DIAGNOSIS — F339 Major depressive disorder, recurrent, unspecified: Secondary | ICD-10-CM

## 2019-03-15 ENCOUNTER — Encounter (HOSPITAL_COMMUNITY): Payer: Self-pay | Admitting: Psychiatry

## 2019-03-15 ENCOUNTER — Ambulatory Visit (INDEPENDENT_AMBULATORY_CARE_PROVIDER_SITE_OTHER): Payer: Medicare Other | Admitting: Psychiatry

## 2019-03-15 ENCOUNTER — Other Ambulatory Visit: Payer: Self-pay

## 2019-03-15 DIAGNOSIS — F411 Generalized anxiety disorder: Secondary | ICD-10-CM | POA: Diagnosis not present

## 2019-03-15 DIAGNOSIS — F339 Major depressive disorder, recurrent, unspecified: Secondary | ICD-10-CM | POA: Diagnosis not present

## 2019-03-15 MED ORDER — CELEXA 10 MG PO TABS: 10.0000 mg | ORAL_TABLET | Freq: Two times a day (BID) | ORAL | 0 refills | Status: DC
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
## 2019-05-19 ENCOUNTER — Encounter (HOSPITAL_COMMUNITY): Payer: Self-pay | Admitting: Licensed Clinical Social Worker

## 2019-05-19 ENCOUNTER — Ambulatory Visit (INDEPENDENT_AMBULATORY_CARE_PROVIDER_SITE_OTHER): Payer: Medicare Other | Admitting: Licensed Clinical Social Worker

## 2019-05-19 ENCOUNTER — Other Ambulatory Visit: Payer: Self-pay

## 2019-05-19 DIAGNOSIS — F330 Major depressive disorder, recurrent, mild: Secondary | ICD-10-CM | POA: Diagnosis not present

## 2019-05-19 NOTE — Progress Notes (Signed)
Virtual Visit via Telephone Note  I connected with [name] on 05/19/19 at  3:30 PM EDT by telephone and verified that I am speaking with the correct person using two identifiers.    I discussed the limitations, risks, security and privacy concerns of performing an evaluation and management service by telephone and the availability of in person appointments. I also discussed with the patient that there may be a patient responsible charge related to this service. The patient expressed understanding and agreed to proceed.   Type of Therapy: Individual Therapy  Treatment Goals addressed: Improve Psychiatric Symptoms, elevate mood (increased self-esteem, increased self-compassion, increased interaction), improve unhelpful thought patterns, controlled behavior, moderate mood, deliberate speech and thought process(improved social functioning, healthy adjustment to living situation), Learn about diagnosis, healthy coping skills  Interventions: Motivational Interviewing, CBT, Grounding & Mindfulness Techniques, psychoeducation  Summary: Christina [name] is a 73 y.o. female who presents with Major Depressive Disorder, recurrent episode, mild  Suicidal/Homicidal: No - without intent/plan  Therapist Response:  [name] met with clinician for an individual session. [name] discussed her psychiatric symptoms, her current life events and her homework. [name] shared that overall life is not boring. Clinician explored work and home life. [name] updated on changes at work, coping with COVID restrictions, and her willingness and ability to continue working amid the chaos. Clinician explored updates in the family. [name] reported overall happiness in the family, with the exception of her oldest son, who may be getting divorced. [name] processed her thoughts and feelings about her children and noted some excitement for an upcoming wedding for her youngest son.  Clinician explored mood, appetite, and sleep. [name] reports  overall thing are fine. She identified some random waking in the night, but she says she will still wake up fine and feel rested. She reports her medication helps a lot.   Plan: Return again in 3 months  Diagnosis: Axis I: Major Depressive Disorder, recurrent episode, mild   I discussed the assessment and treatment plan with the patient. The patient was provided an opportunity to ask questions and all were answered. The patient agreed with the plan and demonstrated an understanding of the instructions.   The patient was advised to call back or seek an in-person evaluation if the symptoms worsen or if the condition fails to improve as anticipated.  I provided 30 minutes of non-face-to-face time during this encounter.   [name], LCSW

## 2019-05-26 ENCOUNTER — Ambulatory Visit: Payer: Medicare Other | Admitting: Neurology

## 2019-06-15 ENCOUNTER — Encounter (HOSPITAL_COMMUNITY): Payer: Self-pay | Admitting: Psychiatry

## 2019-06-15 ENCOUNTER — Ambulatory Visit (INDEPENDENT_AMBULATORY_CARE_PROVIDER_SITE_OTHER): Payer: Medicare Other | Admitting: Psychiatry

## 2019-06-15 ENCOUNTER — Other Ambulatory Visit: Payer: Self-pay

## 2019-06-15 DIAGNOSIS — F339 Major depressive disorder, recurrent, unspecified: Secondary | ICD-10-CM

## 2019-06-15 DIAGNOSIS — F411 Generalized anxiety disorder: Secondary | ICD-10-CM | POA: Diagnosis not present

## 2019-06-15 MED ORDER — CELEXA 10 MG PO TABS: 10.0000 mg | ORAL_TABLET | Freq: Two times a day (BID) | ORAL | 0 refills | Status: DC
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
## 2019-07-07 ENCOUNTER — Encounter: Payer: Self-pay | Admitting: Neurology

## 2019-07-07 ENCOUNTER — Ambulatory Visit: Payer: Medicare Other | Admitting: Neurology

## 2019-07-07 ENCOUNTER — Other Ambulatory Visit: Payer: Self-pay

## 2019-07-07 VITALS — BP 146/96 | HR 64 | Temp 98.0°F | Ht 63.0 in | Wt 174.0 lb

## 2019-07-07 DIAGNOSIS — R413 Other amnesia: Secondary | ICD-10-CM

## 2019-07-07 NOTE — Progress Notes (Signed)
I have read the note, and I agree with the clinical assessment and plan.  Charles K Willis   

## 2019-08-25 ENCOUNTER — Ambulatory Visit (INDEPENDENT_AMBULATORY_CARE_PROVIDER_SITE_OTHER): Payer: Medicare Other | Admitting: Licensed Clinical Social Worker

## 2019-08-25 ENCOUNTER — Other Ambulatory Visit: Payer: Self-pay

## 2019-08-25 ENCOUNTER — Encounter (HOSPITAL_COMMUNITY): Payer: Self-pay | Admitting: Licensed Clinical Social Worker

## 2019-08-25 DIAGNOSIS — F330 Major depressive disorder, recurrent, mild: Secondary | ICD-10-CM

## 2019-08-25 NOTE — Progress Notes (Signed)
Virtual Visit via Telephone Note  I connected with Christina [name] on 08/25/19 at  3:30 PM EST by telephone and verified that I am speaking with the correct person using two identifiers.     I discussed the limitations, risks, security and privacy concerns of performing an evaluation and management service by telephone and the availability of in person appointments. I also discussed with the patient that there may be a patient responsible charge related to this service. The patient expressed understanding and agreed to proceed.  Type of Therapy: Individual Therapy  Treatment Goals addressed: Improve Psychiatric Symptoms, elevate mood (increased self-esteem, increased self-compassion, increased interaction), improve unhelpful thought patterns, controlled behavior, moderate mood, deliberate speech and thought process(improved social functioning, healthy adjustment to living situation), Learn about diagnosis, healthy coping skills  Interventions: Motivational Interviewing, CBT, Grounding & Mindfulness Techniques, psychoeducation  Summary: Christina [name] is a 73 y.o. female who presents with Major Depressive Disorder, recurrent episode, mild  Suicidal/Homicidal: No - without intent/plan  Therapist Response:  [name] met with clinician for an individual session. Sybol discussed her psychiatric symptoms, her current life events and her homework. [name] shared that overall life is going well. She had 2 weeks off at Thanksgiving and will return to work on Monday. She reports her son from [place] came to visit her for Thanksgiving, but she did not let him in the house and kept social distance while he visited. Clinician reflected the challenge this posed, but also noted that she was still happy to see him even though she did not hug him.  Clinician explored mood and interactions at work and socially. [name] reported an issue getting her meds from the mail in pharmacy, noting that it took almost 2 weeks to get  her meds and she had to cut the pills in half to get through. She reports the meds finally came today in the mail, so she is hopeful that she will be able to re-establish levels in her system in time for work Monday.  [name] also reports she received contact from her youngest brother a while ago and she has decided to write back to him. She felt nervous and excited, but also a little scared and depressed. However, she felt it was the right thing to do.   Plan: Return again in 3 months  Diagnosis: Axis I: Major Depressive Disorder, recurrent episode, mild    I discussed the assessment and treatment plan with the patient. The patient was provided an opportunity to ask questions and all were answered. The patient agreed with the plan and demonstrated an understanding of the instructions.   The patient was advised to call back or seek an in-person evaluation if the symptoms worsen or if the condition fails to improve as anticipated.  I provided 40 minutes of non-face-to-face time during this encounter.   [name], LCSW

## 2019-09-13 ENCOUNTER — Ambulatory Visit (HOSPITAL_COMMUNITY): Payer: Medicare Other | Admitting: Psychiatry

## 2019-09-20 ENCOUNTER — Ambulatory Visit (HOSPITAL_COMMUNITY): Payer: Medicare Other | Admitting: Psychiatry

## 2019-09-20 ENCOUNTER — Other Ambulatory Visit: Payer: Self-pay

## 2019-10-13 ENCOUNTER — Other Ambulatory Visit (HOSPITAL_COMMUNITY): Payer: Self-pay | Admitting: Psychiatry

## 2019-10-13 DIAGNOSIS — F339 Major depressive disorder, recurrent, unspecified: Secondary | ICD-10-CM

## 2019-10-13 DIAGNOSIS — F411 Generalized anxiety disorder: Secondary | ICD-10-CM

## 2019-11-22 ENCOUNTER — Other Ambulatory Visit (HOSPITAL_COMMUNITY): Payer: Self-pay | Admitting: Psychiatry

## 2019-11-22 DIAGNOSIS — F411 Generalized anxiety disorder: Secondary | ICD-10-CM

## 2019-11-22 DIAGNOSIS — F339 Major depressive disorder, recurrent, unspecified: Secondary | ICD-10-CM

## 2019-11-25 ENCOUNTER — Encounter (HOSPITAL_COMMUNITY): Payer: Self-pay | Admitting: Psychiatry

## 2019-11-25 ENCOUNTER — Other Ambulatory Visit: Payer: Self-pay

## 2019-11-25 ENCOUNTER — Ambulatory Visit (INDEPENDENT_AMBULATORY_CARE_PROVIDER_SITE_OTHER): Payer: Medicare Other | Admitting: Psychiatry

## 2019-11-25 DIAGNOSIS — F411 Generalized anxiety disorder: Secondary | ICD-10-CM | POA: Diagnosis not present

## 2019-11-25 DIAGNOSIS — F339 Major depressive disorder, recurrent, unspecified: Secondary | ICD-10-CM | POA: Diagnosis not present

## 2019-11-25 MED ORDER — CELEXA 10 MG PO TABS: 10.0000 mg | ORAL_TABLET | Freq: Two times a day (BID) | ORAL | 1 refills | Status: AC
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
## 2020-05-04 ENCOUNTER — Other Ambulatory Visit (HOSPITAL_COMMUNITY): Payer: Self-pay | Admitting: Psychiatry

## 2020-05-04 DIAGNOSIS — F411 Generalized anxiety disorder: Secondary | ICD-10-CM

## 2020-05-04 DIAGNOSIS — F339 Major depressive disorder, recurrent, unspecified: Secondary | ICD-10-CM

## 2020-05-17 ENCOUNTER — Other Ambulatory Visit: Payer: Self-pay | Admitting: Family Medicine

## 2020-05-17 DIAGNOSIS — N6459 Other signs and symptoms in breast: Secondary | ICD-10-CM

## 2020-05-18 ENCOUNTER — Other Ambulatory Visit: Payer: Self-pay | Admitting: Family Medicine

## 2020-05-18 DIAGNOSIS — N649 Disorder of breast, unspecified: Secondary | ICD-10-CM

## 2020-05-24 ENCOUNTER — Telehealth (HOSPITAL_COMMUNITY): Payer: Medicare Other | Admitting: Psychiatry

## 2020-06-12 ENCOUNTER — Ambulatory Visit
Admission: RE | Admit: 2020-06-12 | Discharge: 2020-06-12 | Disposition: A | Discharge status: Home / Self Care | Payer: Medicare Other | Source: Ambulatory Visit | Attending: Family Medicine | Admitting: Family Medicine

## 2020-06-12 ENCOUNTER — Other Ambulatory Visit: Payer: Self-pay

## 2020-06-12 ENCOUNTER — Other Ambulatory Visit: Payer: Self-pay | Admitting: Family Medicine

## 2020-06-12 DIAGNOSIS — N6320 Unspecified lump in the left breast, unspecified quadrant: Secondary | ICD-10-CM

## 2020-06-12 DIAGNOSIS — N649 Disorder of breast, unspecified: Secondary | ICD-10-CM

## 2020-06-12 DIAGNOSIS — N6459 Other signs and symptoms in breast: Secondary | ICD-10-CM

## 2020-06-12 DIAGNOSIS — C801 Malignant (primary) neoplasm, unspecified: Secondary | ICD-10-CM

## 2020-06-12 HISTORY — PX: BREAST LUMPECTOMY: SHX2

## 2020-06-12 HISTORY — DX: Malignant (primary) neoplasm, unspecified: C80.1

## 2020-07-03 ENCOUNTER — Ambulatory Visit
Admission: RE | Admit: 2020-07-03 | Discharge: 2020-07-03 | Disposition: A | Discharge status: Home / Self Care | Payer: Medicare Other | Source: Ambulatory Visit | Attending: Family Medicine | Admitting: Family Medicine

## 2020-07-03 ENCOUNTER — Other Ambulatory Visit: Payer: Self-pay

## 2020-07-03 DIAGNOSIS — N6459 Other signs and symptoms in breast: Secondary | ICD-10-CM

## 2020-07-06 ENCOUNTER — Encounter: Payer: Self-pay | Admitting: *Deleted

## 2020-07-06 DIAGNOSIS — Z170 Estrogen receptor positive status [ER+]: Secondary | ICD-10-CM

## 2020-07-06 DIAGNOSIS — C50212 Malignant neoplasm of upper-inner quadrant of left female breast: Secondary | ICD-10-CM | POA: Insufficient documentation

## 2020-07-11 NOTE — Progress Notes (Signed)
[facility] CONSULT NOTE  Patient Care Team: Lawerance Cruel, MD as PCP - General (Family Medicine) Mauro Kaufmann, RN as Oncology Nurse Navigator Rockwell Germany, RN as Oncology Nurse Navigator  CHIEF COMPLAINTS/PURPOSE OF CONSULTATION:  Newly diagnosed breast cancer  HISTORY OF PRESENTING ILLNESS:  Christina [name] 74 y.o. female is here because of recent diagnosis of invasive ductal carcinoma of the left breast. Patient's physician palpated a right breast mass. Diagnostic mammogram and [place] on 06/12/20 showed no right breast abnormalities and a 1.0cm left breast mass at the 9 o'clock position with a focally dilated duct, 3.4cm, and no suspicious axillary adenopathy. Biopsy on 07/03/20 showed invasive ductal carcinoma, grade 2, HER-2 negative (0), ER/PR+ 100%, Ki67 30%. She presents to the clinic today for initial evaluation and discussion of treatment options.   I reviewed her records extensively and collaborated the history with the patient.  SUMMARY OF ONCOLOGIC HISTORY: Oncology History  Malignant neoplasm of upper-inner quadrant of left breast in female, estrogen receptor positive ([place])  07/06/2020 Initial Diagnosis   Patient's physician palpated a right breast mass. Mammogram showed no right breast abnormalities, and a 1.0cm left breast mass at the 9 o'clock position with a focally dilated duct, 3.4cm, and no suspicious axillary adenopathy. Biopsy showed IDC, grade 2, HER-2 negative (0), ER/PR+ 100%, Ki67 30%.   07/12/2020 Cancer Staging   Staging form: Breast, AJCC 8th Edition - Clinical stage from 07/12/2020: Stage IA (cT1b, cN0, cM0, G2, ER+, PR+, HER2-) - Signed by Nicholas Lose, MD on 07/12/2020     MEDICAL HISTORY:  Past Medical History:  Diagnosis Date  . Anxiety   . Arthritis   . Blood transfusion   . Chicken pox   . Degenerative arthritis   . Depression   . Family history of thyroid problem   . HTN (hypertension)   . Irritable bowel  syndrome   . Memory deficits 12/16/2013  . Positive TB test     SURGICAL HISTORY: Past Surgical History:  Procedure Laterality Date  . ADENOIDECTOMY    . d [place]    . DILATION AND CURETTAGE OF UTERUS    . THYROID SURGERY  1968  . TONSILLECTOMY  1951    SOCIAL HISTORY: Social History   Socioeconomic History  . Marital status: Divorced    Spouse name: Not on file  . Number of children: 4  . Years of education: college 1  . Highest education level: Not on file  Occupational History  . Occupation: FLOATER    Employer: [employer]  Tobacco Use  . Smoking status: Never Smoker  . Smokeless tobacco: Never Used  Vaping Use  . Vaping Use: Never used  Substance and Sexual Activity  . Alcohol use: No    Alcohol/week: 0.0 standard drinks    Comment: rare  . Drug use: No  . Sexual activity: Never  Other Topics Concern  . Not on file  Social History Narrative   Patient lives at home and she is widowed.    Patient works full time.   Education one year of college.   Right handed.   Caffeine rare sweet tea.   Social Determinants of Health   Financial Resource Strain:   . Difficulty of Paying Living Expenses: Not on file  Food Insecurity:   . Worried About Charity fundraiser in the Last Year: Not on file  . Ran Out of Food in the Last Year: Not on file  Transportation Needs:   .  Lack of Transportation (Medical): Not on file  . Lack of Transportation (Non-Medical): Not on file  Physical Activity:   . Days of Exercise per Week: Not on file  . Minutes of Exercise per Session: Not on file  Stress:   . Feeling of Stress : Not on file  Social Connections:   . Frequency of Communication with Friends and Family: Not on file  . Frequency of Social Gatherings with Friends and Family: Not on file  . Attends Religious Services: Not on file  . Active Member of Clubs or Organizations: Not on file  . Attends Archivist Meetings: Not on file  . Marital Status:  Not on file  Intimate Partner Violence:   . Fear of Current or Ex-Partner: Not on file  . Emotionally Abused: Not on file  . Physically Abused: Not on file  . Sexually Abused: Not on file    FAMILY HISTORY: Family History  Problem Relation Age of Onset  . Arthritis Mother   . Hyperlipidemia Mother   . Hypertension Mother   . Dementia Mother   . Arthritis Father   . Prostate cancer Father   . Hypertension Father   . Heart disease Sister        congenital heart disease  . Stroke Sister   . Dementia Sister   . Cancer - Colon Brother   . Stroke Brother   . Other Brother   . Pulmonary embolism Brother   . Other Brother   . Stroke Brother   . Drug abuse Brother   . Other Brother   . Schizophrenia Brother   . Other Brother   . Arthritis Maternal Grandmother   . Hypertension Maternal Grandmother   . Arthritis Maternal Grandfather   . Hypertension Maternal Grandfather   . Arthritis Paternal Grandmother   . Hypertension Paternal Grandmother   . Arthritis Paternal Grandfather   . Hypertension Paternal Grandfather   . Diabetes Paternal Grandfather     ALLERGIES:  is allergic to peanut-containing drug products, pork-derived products, synthroid [levothyroxine sodium], and valium [diazepam].  MEDICATIONS:  Current Outpatient Medications  Medication Sig Dispense Refill  . Acai 500 MG CAPS Take 2 capsules by mouth daily. Reported on 12/20/2015    . aspirin 81 MG chewable tablet Chew 81 mg by mouth as needed.     . beta carotene 15 MG capsule Take 15 mg by mouth daily. Reported on 12/20/2015    . CELEXA 10 MG tablet Take 1 tablet (10 mg total) by mouth 2 (two) times daily. 180 tablet 1  . cholecalciferol (VITAMIN D) 1000 UNITS tablet Take 5,000 Units by mouth daily.     . Cyanocobalamin (B-12 IJ) Inject as directed as directed.    . folic acid (FOLVITE) 967 MCG tablet Take 800 mcg by mouth daily. Reported on 12/20/2015    . irbesartan (AVAPRO) 150 MG tablet TK 1 T PO QD  0  .  Lactobacillus-Inulin (CULTURELLE DIGESTIVE HEALTH PO) Take 1 capsule by mouth. Reported on 12/20/2015    . Lecithin 1200 MG CAPS Take 1 capsule by mouth daily.    . Melatonin 3 MG CAPS Take 5 capsules by mouth daily.     . metoprolol succinate (TOPROL-XL) 25 MG 24 hr tablet TK 1 T PO  QD  6  . Misc Natural Products (ADRENAL PO) Take 1 capsule by mouth daily.    Marland Kitchen thiamine (VITAMIN B-1) 100 MG tablet Take 100 mg by mouth daily. Reported on 12/20/2015    .  THYROID PO Take 1 capsule by mouth.    Marland Kitchen UNABLE TO FIND Take 1 tablet by mouth daily. Med Name: Ginko biloba    . vitamin A 10000 UNIT capsule Take 10,000 Units by mouth daily.    . vitamin E 400 UNIT capsule Take 400 Units by mouth daily.     No current facility-administered medications for this visit.    REVIEW OF SYSTEMS:    All other systems were reviewed with the patient and are negative.  PHYSICAL EXAMINATION: ECOG PERFORMANCE STATUS: 1 - Symptomatic but completely ambulatory  Vitals:   07/12/20 1301  BP: (!) 134/96  Pulse: 81  Resp: 18  Temp: 97.7 F (36.5 C)  SpO2: 92%   Filed Weights   07/12/20 1301  Weight: 175 lb 12.8 oz (79.7 kg)    LABORATORY DATA:  I have reviewed the data as listed Lab Results  Component Value Date   WBC 6.8 07/12/2020   HGB 14.0 07/12/2020   HCT 41.6 07/12/2020   MCV 86.5 07/12/2020   PLT 324 07/12/2020   Lab Results  Component Value Date   NA 139 07/12/2020   K 3.6 07/12/2020   CL 104 07/12/2020   CO2 25 07/12/2020    RADIOGRAPHIC STUDIES: I have personally reviewed the radiological reports and agreed with the findings in the report.  ASSESSMENT AND PLAN:  Malignant neoplasm of upper-inner quadrant of left breast in female, estrogen receptor positive ([place]) 07/06/2020: Patient's physician palpated a right breast mass. Mammogram showed no right breast abnormalities, and a 1.0cm left breast mass at the 9 o'clock position with a focally dilated duct, 3.4cm, and no suspicious  axillary adenopathy. Biopsy showed IDC, grade 2, HER-2 negative (0), ER/PR+ 100%, Ki67 30%.  Pathology and radiology counseling:Discussed with the patient, the details of pathology including the type of breast cancer,the clinical staging, the significance of ER, PR and HER-2/neu receptors and the implications for treatment. After reviewing the pathology in detail, we proceeded to discuss the different treatment options between surgery, radiation, chemotherapy, antiestrogen therapies.  Recommendations: 1. Breast conserving surgery followed by 2. +/-  Oncotype DX testing to determine if chemotherapy would be of any benefit followed by 3. Adjuvant radiation therapy followed by 4. Adjuvant antiestrogen therapy Breast MRI will also be ordered  Oncotype counseling: I discussed Oncotype DX test. I explained to the patient that this is a 21 gene panel to evaluate patient tumors DNA to calculate recurrence score. This would help determine whether patient has high risk or intermediate risk or low risk breast cancer. She understands that if her tumor was found to be high risk, she would benefit from systemic chemotherapy. If low risk, no need of chemotherapy. If she was found to be intermediate risk, we would need to evaluate the score as well as other risk factors and determine if an abbreviated chemotherapy may be of benefit.  Return to clinic after surgery to discuss final pathology report and then determine if Oncotype DX testing will need to be sent.      All questions were answered. The patient knows to call the clinic with any problems, questions or concerns.   Rulon Eisenmenger, MD, MPH 07/12/2020    I, Molly Dorshimer, am acting as scribe for Nicholas Lose, MD.  I have reviewed the above documentation for accuracy and completeness, and I agree with the above.

## 2020-07-11 NOTE — Progress Notes (Signed)
Radiation Oncology         [phone] ________________________________  Initial Outpatient Consultation  Name: Christina [name] MRN: [id number]  Date: 07/12/2020  DOB: 1946/03/13  YF:VCBS, Dwyane Luo, MD  Coralie Keens, MD   REFERRING PHYSICIAN: Coralie Keens, MD  DIAGNOSIS:    ICD-10-CM   1. Malignant neoplasm of upper-inner quadrant of left breast in female, estrogen receptor positive [facility])  C50.212    Z17.0     Cancer Staging Malignant neoplasm of upper-inner quadrant of left breast in female, estrogen receptor positive ([place]) Staging form: Breast, AJCC 8th Edition - Clinical stage from 07/12/2020: Stage IA (cT1b, cN0, cM0, G2, ER+, PR+, HER2-) - Signed by Nicholas Lose, MD on 07/12/2020   CHIEF COMPLAINT: Here to discuss management of left breast cancer  HISTORY OF PRESENT ILLNESS::Christina [name] is a 74 y.o. female who presented a palpable mass in the right breast which prompted diagnostic imaging.  It turned out that the right breast did not reveal any malignancy but she demonstrated left breast abnormality on the following imaging: bilateral diagnostic mammogram on the date of 06/12/2020. Ultrasound on 06/12/2020 revealed a suspicious left breast mass associated with a focally dilated duct. There was no suspicious left axillary lymphadenopathy, nor were there any suspicious findings on the right. Biopsy on date of 07/03/2020 showed invasive ductal carcinoma. ER status: 100% strong; PR status: 100% strong; Her2 status: negative; Grade 2.  She has been discussed at tumor board and the consensus of the team is that she would benefit from an MRI for more tissue definition and will likely be a good candidate to undergo undergo lumpectomy and sentinel lymph node biopsy.  PREVIOUS RADIATION THERAPY: No  PAST MEDICAL HISTORY:  has a past medical history of Anxiety, Arthritis, Blood transfusion, Chicken pox, Degenerative arthritis, Depression, Family  history of thyroid problem, HTN (hypertension), Irritable bowel syndrome, Memory deficits (12/16/2013), and Positive TB test.    PAST SURGICAL HISTORY: Past Surgical History:  Procedure Laterality Date  . ADENOIDECTOMY    . d [place]    . DILATION AND CURETTAGE OF UTERUS    . THYROID SURGERY  1968  . TONSILLECTOMY  1951    FAMILY HISTORY: family history includes Arthritis in her father, maternal grandfather, maternal grandmother, mother, paternal grandfather, and paternal grandmother; Cancer - Colon in her brother; Dementia in her mother and sister; Diabetes in her paternal grandfather; Drug abuse in her brother; Heart disease in her sister; Hyperlipidemia in her mother; Hypertension in her father, maternal grandfather, maternal grandmother, mother, paternal grandfather, and paternal grandmother; Other in her brother, brother, brother, and brother; Prostate cancer in her father; Pulmonary embolism in her brother; Schizophrenia in her brother; Stroke in her brother, brother, and sister.  SOCIAL HISTORY:  reports that she has never smoked. She has never used smokeless tobacco. She reports that she does not drink alcohol and does not use drugs.  ALLERGIES: Peanut-containing drug products, Pork-derived products, Synthroid [levothyroxine sodium], and Valium [diazepam]  MEDICATIONS:  Current Outpatient Medications  Medication Sig Dispense Refill  . Acai 500 MG CAPS Take 2 capsules by mouth daily. Reported on 12/20/2015    . aspirin 81 MG chewable tablet Chew 81 mg by mouth as needed.     . beta carotene 15 MG capsule Take 15 mg by mouth daily. Reported on 12/20/2015    . CELEXA 10 MG tablet Take 1 tablet (10 mg total) by mouth 2 (two) times daily. 180 tablet 1  . cholecalciferol (VITAMIN D)  1000 UNITS tablet Take 5,000 Units by mouth daily.     . Cyanocobalamin (B-12 IJ) Inject as directed as directed.    . folic acid (FOLVITE) 426 MCG tablet Take 800 mcg by mouth daily. Reported on 12/20/2015    .  irbesartan (AVAPRO) 150 MG tablet TK 1 T PO QD  0  . Lactobacillus-Inulin (CULTURELLE DIGESTIVE HEALTH PO) Take 1 capsule by mouth. Reported on 12/20/2015    . Lecithin 1200 MG CAPS Take 1 capsule by mouth daily.    . Melatonin 3 MG CAPS Take 5 capsules by mouth daily.     . metoprolol succinate (TOPROL-XL) 25 MG 24 hr tablet TK 1 T PO  QD  6  . Misc Natural Products (ADRENAL PO) Take 1 capsule by mouth daily.    Marland Kitchen thiamine (VITAMIN B-1) 100 MG tablet Take 100 mg by mouth daily. Reported on 12/20/2015    . THYROID PO Take 1 capsule by mouth.    Marland Kitchen UNABLE TO FIND Take 1 tablet by mouth daily. Med Name: Ginko biloba    . vitamin A 10000 UNIT capsule Take 10,000 Units by mouth daily.    . vitamin E 400 UNIT capsule Take 400 Units by mouth daily.     No current facility-administered medications for this encounter.    REVIEW OF SYSTEMS: As above    PHYSICAL EXAM:  vitals were not taken for this visit.   General: Alert and oriented, in no acute distress Psychiatric: Judgment and insight are intact. Affect is appropriate. Breasts: Notable for some heterogeneous breast tissue in the upper inner quadrant of the left breast.  There is a bruise at the biopsy site. No other palpable masses appreciated in the breasts or axillae bilaterally.   ECOG = 1  0 - Asymptomatic (Fully active, able to carry on all predisease activities without restriction)  1 - Symptomatic but completely ambulatory (Restricted in physically strenuous activity but ambulatory and able to carry out work of a light or sedentary nature. For example, light housework, office work)  2 - Symptomatic, <50% in bed during the day (Ambulatory and capable of all self care but unable to carry out any work activities. Up and about more than 50% of waking hours)  3 - Symptomatic, >50% in bed, but not bedbound (Capable of only limited self-care, confined to bed or chair 50% or more of waking hours)  4 - Bedbound (Completely disabled. Cannot  carry on any self-care. Totally confined to bed or chair)  5 - Death   Eustace Pen MM, Creech RH, Tormey DC, et al. [phone]). "Toxicity and response criteria of the [facility] Group". [place] Oncol. 5 (6): 649-55   LABORATORY DATA:  Lab Results  Component Value Date   WBC 6.8 07/12/2020   HGB 14.0 07/12/2020   HCT 41.6 07/12/2020   MCV 86.5 07/12/2020   PLT 324 07/12/2020   CMP     Component Value Date/Time   NA 139 07/12/2020 1219   K 3.6 07/12/2020 1219   CL 104 07/12/2020 1219   CO2 25 07/12/2020 1219   GLUCOSE 92 07/12/2020 1219   BUN 10 07/12/2020 1219   CREATININE 0.81 07/12/2020 1219   CALCIUM 9.1 07/12/2020 1219   PROT 6.9 07/12/2020 1219   ALBUMIN 3.8 07/12/2020 1219   AST 16 07/12/2020 1219   ALT 18 07/12/2020 1219   ALKPHOS 90 07/12/2020 1219   BILITOT 0.6 07/12/2020 1219   GFRNONAA >60 07/12/2020 1219  RADIOGRAPHY: MM CLIP PLACEMENT LEFT  Result Date: 07/03/2020 CLINICAL DATA:  Evaluate biopsy marker EXAM: DIAGNOSTIC LEFT MAMMOGRAM POST ULTRASOUND BIOPSY COMPARISON:  Previous exam(s). FINDINGS: Mammographic images were obtained following ultrasound guided biopsy of a left breast mass. The biopsy marking clip is in expected position at the site of biopsy. IMPRESSION: Appropriate positioning of the ribbon shaped biopsy marking clip at the site of biopsy in the biopsied left breast mass. Final Assessment: Post Procedure Mammograms for Marker Placement Electronically Signed   By: Dorise Bullion III M.D   On: 07/03/2020 08:23   [place] LT BREAST BX W LOC DEV 1ST LESION IMG BX SPEC US GUIDE  Addendum Date: 07/05/2020   ADDENDUM REPORT: 07/04/2020 14:00 ADDENDUM: Pathology revealed GRADE II INVASIVE DUCTAL CARCINOMA of the Left breast, 9 o'clock . This was found to be concordant by Dr. Dorise Bullion. Pathology results were discussed with the patient by telephone. The patient reported doing well after the biopsy with minimal tenderness at the site.  Post biopsy instructions and care were reviewed and questions were answered. The patient was encouraged to call [facility] for any additional concerns. My direct phone number was provided. The patient was referred to [facility] on July 12, 2020. Recommendation for a bilateral breast MRI for extent to exclude any additional sites of disease. Pathology results reported by Terie Purser, RN on 07/04/2020. Electronically Signed   By: Dorise Bullion III M.D   On: 07/04/2020 14:00   Result Date: 07/05/2020 CLINICAL DATA:  Biopsy of a left breast mass EXAM: ULTRASOUND GUIDED LEFT BREAST CORE NEEDLE BIOPSY COMPARISON:  Previous exam(s). PROCEDURE: I met with the patient and we discussed the procedure of ultrasound-guided biopsy, including benefits and alternatives. We discussed the high likelihood of a successful procedure. We discussed the risks of the procedure, including infection, bleeding, tissue injury, clip migration, and inadequate sampling. Informed written consent was given. The usual time-out protocol was performed immediately prior to the procedure. Lesion quadrant: 9 o'clock Using sterile technique and 1% Lidocaine as local anesthetic, under direct ultrasound visualization, a 12 gauge spring-loaded device was used to perform biopsy of the 9 o'clock right breast mass using a medial approach. At the conclusion of the procedure tissue marker clip was deployed into the biopsy cavity. Follow up 2 view mammogram was performed and dictated separately. IMPRESSION: Ultrasound guided biopsy of a left breast mass. No apparent complications. Electronically Signed: By: Dorise Bullion III M.D On: 07/03/2020 08:13      IMPRESSION/PLAN: Left breast cancer  It was a pleasure meeting the patient today. We discussed the risks, benefits, and side effects of radiotherapy.   For the patient's early stage favorable  risk breast cancer, we had a thorough discussion about her options for adjuvant therapy. One option would be antiestrogen therapy as discussed with medical oncology. She would take a pill for approximately 5 years. The alternative option (but less standard) would be radiotherapy to the breast. The most aggressive option would be to pursue both modalities.  Of note, I discussed the data from the [employer] al trial in the [place] [employer]. She understands that tamoxifen compared to radiation plus tamoxifen demonstrated no survival benefit among the women in this study. The women were 32 years or older with stage I estrogen receptor positive breast cancer. Based on this study, I told the patient that her overall life expectancy should not be  affected by adding radiotherapy to antiestrogen medication. She understands that the main benefit of  adding radiotherapy to anti estrogen therapy would be a very small but measurable local control benefit (risk of local recurrence to be lowered from ~9% --> ~2% over a decade).    We discussed the risks benefits and side effects of radiotherapy. She understands that the side effects would likely include some skin irritation and fatigue during the weeks of radiation. There is a risk of late effects which include but are not necessarily limited to cosmetic changes and rare lung toxicity. I would anticipate delivering approximately 3-4 weeks of radiotherapy.  After a thorough discussion, the patient would like to think about her options.  I asked our patient navigator to schedule a post-op followup with me so we can review her pathology and discuss her options further.  On date of service, in total, I spent 45 minutes on this encounter. Patient was seen in person.   __________________________________________   Eppie Gibson, MD  This document serves as a record of services personally performed by Eppie Gibson, MD. It was created on his behalf by Clerance Lav, a trained medical scribe. The creation of this record is based on the scribe's personal observations and the provider's statements to them. This document has been checked and approved by the attending provider.

## 2020-07-12 ENCOUNTER — Encounter: Payer: Self-pay | Admitting: Radiation Oncology

## 2020-07-12 ENCOUNTER — Encounter: Payer: Self-pay | Admitting: *Deleted

## 2020-07-12 ENCOUNTER — Encounter: Payer: Self-pay | Admitting: Physical Therapy

## 2020-07-12 ENCOUNTER — Other Ambulatory Visit: Payer: Self-pay | Admitting: Surgery

## 2020-07-12 ENCOUNTER — Inpatient Hospital Stay (HOSPITAL_BASED_OUTPATIENT_CLINIC_OR_DEPARTMENT_OTHER): Payer: Medicare Other | Admitting: Hematology and Oncology

## 2020-07-12 ENCOUNTER — Inpatient Hospital Stay: Payer: Medicare Other | Attending: Hematology and Oncology | Admitting: Internal Medicine

## 2020-07-12 ENCOUNTER — Ambulatory Visit: Payer: Medicare Other | Attending: Surgery | Admitting: Physical Therapy

## 2020-07-12 ENCOUNTER — Other Ambulatory Visit: Payer: Self-pay

## 2020-07-12 ENCOUNTER — Ambulatory Visit
Admission: RE | Admit: 2020-07-12 | Discharge: 2020-07-12 | Disposition: A | Discharge status: Home / Self Care | Payer: Medicare Other | Source: Ambulatory Visit | Attending: Radiation Oncology | Admitting: Radiation Oncology

## 2020-07-12 DIAGNOSIS — Z8249 Family history of ischemic heart disease and other diseases of the circulatory system: Secondary | ICD-10-CM | POA: Diagnosis not present

## 2020-07-12 DIAGNOSIS — Z800 Family history of malignant neoplasm of digestive organs: Secondary | ICD-10-CM | POA: Insufficient documentation

## 2020-07-12 DIAGNOSIS — Z833 Family history of diabetes mellitus: Secondary | ICD-10-CM | POA: Diagnosis not present

## 2020-07-12 DIAGNOSIS — Z814 Family history of other substance abuse and dependence: Secondary | ICD-10-CM | POA: Diagnosis not present

## 2020-07-12 DIAGNOSIS — Z8261 Family history of arthritis: Secondary | ICD-10-CM | POA: Insufficient documentation

## 2020-07-12 DIAGNOSIS — Z836 Family history of other diseases of the respiratory system: Secondary | ICD-10-CM | POA: Diagnosis not present

## 2020-07-12 DIAGNOSIS — C50212 Malignant neoplasm of upper-inner quadrant of left female breast: Secondary | ICD-10-CM | POA: Insufficient documentation

## 2020-07-12 DIAGNOSIS — Z823 Family history of stroke: Secondary | ICD-10-CM | POA: Insufficient documentation

## 2020-07-12 DIAGNOSIS — R293 Abnormal posture: Secondary | ICD-10-CM | POA: Diagnosis present

## 2020-07-12 DIAGNOSIS — Z170 Estrogen receptor positive status [ER+]: Secondary | ICD-10-CM

## 2020-07-12 DIAGNOSIS — Z818 Family history of other mental and behavioral disorders: Secondary | ICD-10-CM | POA: Diagnosis not present

## 2020-07-12 DIAGNOSIS — F329 Major depressive disorder, single episode, unspecified: Secondary | ICD-10-CM | POA: Insufficient documentation

## 2020-07-12 DIAGNOSIS — Z8349 Family history of other endocrine, nutritional and metabolic diseases: Secondary | ICD-10-CM | POA: Insufficient documentation

## 2020-07-12 DIAGNOSIS — Z79899 Other long term (current) drug therapy: Secondary | ICD-10-CM | POA: Insufficient documentation

## 2020-07-12 DIAGNOSIS — Z853 Personal history of malignant neoplasm of breast: Secondary | ICD-10-CM

## 2020-07-12 LAB — CBC WITH DIFFERENTIAL (CANCER CENTER ONLY)
Abs Immature Granulocytes: 0.03 10*3/uL (ref 0.00–0.07)
Basophils Absolute: 0.1 10*3/uL (ref 0.0–0.1)
Basophils Relative: 1 %
Eosinophils Absolute: 0.2 10*3/uL (ref 0.0–0.5)
Eosinophils Relative: 3 %
HCT: 41.6 % (ref 36.0–46.0)
Hemoglobin: 14 g/dL (ref 12.0–15.0)
Immature Granulocytes: 0 %
Lymphocytes Relative: 24 %
Lymphs Abs: 1.7 10*3/uL (ref 0.7–4.0)
MCH: 29.1 pg (ref 26.0–34.0)
MCHC: 33.7 g/dL (ref 30.0–36.0)
MCV: 86.5 fL (ref 80.0–100.0)
Monocytes Absolute: 0.7 10*3/uL (ref 0.1–1.0)
Monocytes Relative: 10 %
Neutro Abs: 4.2 10*3/uL (ref 1.7–7.7)
Neutrophils Relative %: 62 %
Platelet Count: 324 10*3/uL (ref 150–400)
RBC: 4.81 MIL/uL (ref 3.87–5.11)
RDW: 14.2 % (ref 11.5–15.5)
WBC Count: 6.8 10*3/uL (ref 4.0–10.5)
nRBC: 0 % (ref 0.0–0.2)

## 2020-07-12 LAB — CMP (CANCER CENTER ONLY)
ALT: 18 U/L (ref 0–44)
AST: 16 U/L (ref 15–41)
Albumin: 3.8 g/dL (ref 3.5–5.0)
Alkaline Phosphatase: 90 U/L (ref 38–126)
Anion gap: 10 (ref 5–15)
BUN: 10 mg/dL (ref 8–23)
CO2: 25 mmol/L (ref 22–32)
Calcium: 9.1 mg/dL (ref 8.9–10.3)
Chloride: 104 mmol/L (ref 98–111)
Creatinine: 0.81 mg/dL (ref 0.44–1.00)
GFR, Estimated: >60 mL/min (ref >60)
Glucose, Bld: 92 mg/dL (ref 70–99)
Potassium: 3.6 mmol/L (ref 3.5–5.1)
Sodium: 139 mmol/L (ref 135–145)
Total Bilirubin: 0.6 mg/dL (ref 0.3–1.2)
Total Protein: 6.9 g/dL (ref 6.5–8.1)

## 2020-07-12 LAB — GENETIC SCREENING ORDER

## 2020-07-12 NOTE — Therapy (Signed)
[place], [place], [address] Phone: [phone]   Fax:  [phone]  Physical Therapy Evaluation  Patient Details  Name: Christina [name] [id number] MRN: [id number] Date of Birth: Apr 25, 1946 Referring Provider (PT): Dr. Coralie Keens   Encounter Date: 07/12/2020   PT End of Session - 07/12/20 1458    Visit Number 1    Number of Visits 2    Date for PT Re-Evaluation 09/06/20    PT Start Time 1416    PT Stop Time 1450    PT Time Calculation (min) 34 min    Activity Tolerance Patient tolerated treatment well    Behavior During Therapy [name] for tasks assessed/performed           Past Medical History:  Diagnosis Date  . Anxiety   . Arthritis   . Blood transfusion   . Chicken pox   . Degenerative arthritis   . Depression   . Family history of thyroid problem   . HTN (hypertension)   . Irritable bowel syndrome   . Memory deficits 12/16/2013  . Positive TB test     Past Surgical History:  Procedure Laterality Date  . ADENOIDECTOMY    . d [place]    . DILATION AND CURETTAGE OF UTERUS    . THYROID SURGERY  1968  . TONSILLECTOMY  1951    There were no vitals filed for this visit.    Subjective Assessment - 07/12/20 1312    Subjective Patient reports she is here today to be seen by her medical team for her newly diagnosed left breast cancer.    Patient is accompained by: Family member    Pertinent History Patient was diagnosed on 06/12/2020 with left invasive ductal carcinoma breast cancer. It measures 1 cm and is located in the upper inner quadrant. It is ER/PR positive and HER2 negative with a Ki67 of 30%.    Patient Stated Goals Reduce lymphedema risk and learn post op shoulder ROM HEP    Currently in Pain? No/denies              [facility] PT Assessment - 07/12/20 0001      Assessment   Medical Diagnosis Left breast cancer    Referring Provider (PT) Dr. Coralie Keens    Onset  Date/Surgical Date 06/12/20    Hand Dominance Right    Prior Therapy none      Precautions   Precautions Other (comment)    Precaution Comments active cancer      Restrictions   Weight Bearing Restrictions No      Balance Screen   Has the patient fallen in the past 6 months No    Has the patient had a decrease in activity level because of a fear of falling?  No    Is the patient reluctant to leave their home because of a fear of falling?  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    Leisure She does not exercise      Cognition   Overall Cognitive Status Within Functional Limits for tasks assessed      Posture/Postural Control   Posture/Postural Control Postural limitations    Postural Limitations  Rounded Shoulders;Forward head      ROM / Strength   AROM / PROM / Strength AROM;Strength      AROM   Overall AROM Comments Right cervical sidebending limited 25%; others are WNL    AROM Assessment Site Shoulder    Right/Left Shoulder Right;Left    Right Shoulder Extension 53 Degrees    Right Shoulder Flexion 151 Degrees    Right Shoulder ABduction 157 Degrees    Right Shoulder Internal Rotation 65 Degrees    Right Shoulder External Rotation 82 Degrees    Left Shoulder Extension 54 Degrees    Left Shoulder Flexion 153 Degrees    Left Shoulder ABduction 155 Degrees    Left Shoulder Internal Rotation 62 Degrees    Left Shoulder External Rotation 90 Degrees      Strength   Overall Strength Within functional limits for tasks performed             LYMPHEDEMA/ONCOLOGY QUESTIONNAIRE - 07/12/20 0001      Type   Cancer Type Left breast cancer      Lymphedema Assessments   Lymphedema Assessments Upper extremities      Right Upper  Extremity Lymphedema   10 cm Proximal to Olecranon Process 25.2 cm    Olecranon Process 23.8 cm    10 cm Proximal to Ulnar Styloid Process 21.9 cm    Just Proximal to Ulnar Styloid Process 16.3 cm    Across Hand at [employer] 19.5 cm    At [place] of 2nd Digit 6.3 cm      Left Upper Extremity Lymphedema   10 cm Proximal to Olecranon Process 25.8 cm    Olecranon Process 24.5 cm    10 cm Proximal to Ulnar Styloid Process 21.8 cm    Just Proximal to Ulnar Styloid Process 16.3 cm    Across Hand at [employer] 19.3 cm    At [place] of 2nd Digit 6.3 cm           L-DEX FLOWSHEETS - 07/12/20 1400      L-DEX LYMPHEDEMA SCREENING   Measurement Type Unilateral    L-DEX MEASUREMENT EXTREMITY Upper Extremity    POSITION  Standing    DOMINANT SIDE Right    At Risk Side Left    BASELINE SCORE (UNILATERAL) 8.1           The patient was assessed using the L-Dex machine today to produce a lymphedema index baseline score. The patient will be reassessed on a regular basis (typically every 3 months) to obtain new L-Dex scores. If the score is > 6.5 points away from his/her baseline score indicating onset of subclinical lymphedema, it will be recommended to wear a compression garment for 4 weeks, 12 hours per day and then be reassessed. If the score continues to be > 6.5 points from baseline at reassessment, we will initiate lymphedema treatment. Assessing in this manner has a 95% rate of preventing clinically significant lymphedema.      Katina Dung - 07/12/20 0001    Open a tight or new jar No difficulty    Do heavy household chores (wash walls, wash floors) No difficulty    Carry a shopping bag or briefcase No difficulty    Wash your back No difficulty    Use a knife to cut food No difficulty    Recreational activities in which you take some force or impact through your arm, shoulder, or hand (golf, hammering, tennis) Moderate difficulty  During the past week, to what extent has your  arm, shoulder or hand problem interfered with your normal social activities with family, friends, neighbors, or groups? Not at all    During the past week, to what extent has your arm, shoulder or hand problem limited your work or other regular daily activities Not at all    Arm, shoulder, or hand pain. Moderate    Tingling (pins and needles) in your arm, shoulder, or hand Moderate    Difficulty Sleeping No difficulty    DASH Score 13.64 %            Objective measurements completed on examination: See above findings.     Patient was instructed today in a home exercise program today for post op shoulder range of motion. These included active assist shoulder flexion in sitting, scapular retraction, wall walking with shoulder abduction, and hands behind head external rotation.  She was encouraged to do these twice a day, holding 3 seconds and repeating 5 times when permitted by her physician.              PT Education - 07/12/20 1457    Education Details Lymphedema risk reduction and post op shoulder ROM HEP    Person(s) Educated Patient    Methods Explanation;Demonstration;Handout    Comprehension Verbalized understanding;Returned demonstration               PT Long Term Goals - 07/12/20 1500      PT LONG TERM GOAL #1   Title Patient will demonstrate she has regained full shoulder ROM and function post operatively compared to baselines.    Time 8    Period Weeks    Status New    Target Date 09/06/20           Breast Clinic Goals - 07/12/20 1500      Patient will be able to verbalize understanding of pertinent lymphedema risk reduction practices relevant to her diagnosis specifically related to skin care.   Time 1    Period Days    Status Achieved      Patient will be able to return demonstrate and/or verbalize understanding of the post-op home exercise program related to regaining shoulder range of motion.   Time 1    Period Days    Status Achieved       Patient will be able to verbalize understanding of the importance of attending the postoperative After Breast Cancer Class for further lymphedema risk reduction education and therapeutic exercise.   Time 1    Period Days    Status Achieved                 Plan - 07/12/20 1458    Clinical Impression Statement Patient was diagnosed on 06/12/2020 with left invasive ductal carcinoma breast cancer. It measures 1 cm and is located in the upper inner quadrant. It is ER/PR positive and HER2 negative with a Ki67 of 30%. Her multidisciplinary medical team met prior to her assessments to determine a recommended treatment plan. She is planning to have a left lumpectomy and sentinel node biopsy followed by possible radiation and anti-estrogen therapy. She will benefit form a post op PT reassessment to determine needs and from L-Dex screens every 3 months for 2 years to detect subclinical lymphedema.    Stability/Clinical Decision Making Stable/Uncomplicated    Clinical Decision Making Low    Rehab Potential Excellent    PT Frequency --   Eval and 1 f/u visit  PT Treatment/Interventions ADLs/Self Care Home Management;Therapeutic exercise;Patient/family education    PT Next Visit Plan Will reassess 3-4 weeks post op    PT Home Exercise Plan Post op shoulder ROM HEP    Consulted and Agree with Plan of Care Patient           Patient will benefit from skilled therapeutic intervention in order to improve the following deficits and impairments:  Postural dysfunction, Decreased range of motion, Decreased knowledge of precautions, Impaired UE functional use, Pain  Visit Diagnosis: Malignant neoplasm of upper-inner quadrant of left breast in female, estrogen receptor positive ([place]) - Plan: PT plan of care cert/re-cert  Abnormal posture - Plan: PT plan of care cert/re-cert   Patient will follow up at outpatient cancer rehab 3-4 weeks following surgery.  If the patient requires physical therapy at that  time, a specific plan will be dictated and sent to the referring physician for approval. The patient was educated today on appropriate basic range of motion exercises to begin post operatively and the importance of attending the After Breast Cancer class following surgery.  Patient was educated today on lymphedema risk reduction practices as it pertains to recommendations that will benefit the patient immediately following surgery.  She verbalized good understanding.      Problem List Patient Active Problem List   Diagnosis Date Noted  . Malignant neoplasm of upper-inner quadrant of left breast in female, estrogen receptor positive ([place]) 07/06/2020  . Memory deficits 12/16/2013  . Hypertension 05/22/2012  . Hypothyroid 05/22/2012  . Major depression, recurrent, chronic ([place]) 05/22/2012  . Pain, joint, multiple sites 05/22/2012   Annia Friendly, PT 07/12/20 3:03 PM  [place], [place], [address] Phone: [phone]   Fax:  [phone]  Name: Christina [name] [id number] MRN: [id number] Date of Birth: Nov 01, 1945

## 2020-07-12 NOTE — Assessment & Plan Note (Signed)
07/06/2020: Patient's physician palpated a right breast mass. Mammogram showed no right breast abnormalities, and a 1.0cm left breast mass at the 9 o'clock position with a focally dilated duct, 3.4cm, and no suspicious axillary adenopathy. Biopsy showed IDC, grade 2, HER-2 negative (0), ER/PR+ 100%, Ki67 30%.  Pathology and radiology counseling:Discussed with the patient, the details of pathology including the type of breast cancer,the clinical staging, the significance of ER, PR and HER-2/neu receptors and the implications for treatment. After reviewing the pathology in detail, we proceeded to discuss the different treatment options between surgery, radiation, chemotherapy, antiestrogen therapies.  Recommendations: 1. Breast conserving surgery followed by 2. +/-  Oncotype DX testing to determine if chemotherapy would be of any benefit followed by 3. Adjuvant radiation therapy followed by 4. Adjuvant antiestrogen therapy Breast MRI will also be ordered  Oncotype counseling: I discussed Oncotype DX test. I explained to the patient that this is a 21 gene panel to evaluate patient tumors DNA to calculate recurrence score. This would help determine whether patient has high risk or intermediate risk or low risk breast cancer. She understands that if her tumor was found to be high risk, she would benefit from systemic chemotherapy. If low risk, no need of chemotherapy. If she was found to be intermediate risk, we would need to evaluate the score as well as other risk factors and determine if an abbreviated chemotherapy may be of benefit.  Return to clinic after surgery to discuss final pathology report and then determine if Oncotype DX testing will need to be sent.

## 2020-07-12 NOTE — Patient Instructions (Signed)

## 2020-07-13 ENCOUNTER — Encounter: Payer: Self-pay | Admitting: General Practice

## 2020-07-13 ENCOUNTER — Telehealth: Payer: Self-pay | Admitting: Hematology and Oncology

## 2020-07-13 NOTE — Telephone Encounter (Signed)
No 10/20 los, no changes made to pt schedule

## 2020-07-13 NOTE — Progress Notes (Signed)
Christina [name] was accompanied by her sister who is her main support. They are both grateful that they were able to catch it early because of Christina [name] finding another benign cyst on her other side.  Christina [name] is very anxious and requested prayer.  I offered prayer and pastoral presence.  [place], Bcc Pager, [phone] 1:35 PM

## 2020-07-17 ENCOUNTER — Ambulatory Visit
Admission: RE | Admit: 2020-07-17 | Discharge: 2020-07-17 | Disposition: A | Discharge status: Home / Self Care | Payer: Medicare Other | Source: Ambulatory Visit | Attending: Surgery | Admitting: Surgery

## 2020-07-17 ENCOUNTER — Other Ambulatory Visit: Payer: Self-pay

## 2020-07-17 DIAGNOSIS — Z170 Estrogen receptor positive status [ER+]: Secondary | ICD-10-CM

## 2020-07-17 MED ORDER — GADOBUTROL 1 MMOL/ML IV SOLN
8.0000 mL | Freq: Once | INTRAVENOUS | Status: AC | PRN
  Administered 2020-07-17: 8 mL via INTRAVENOUS

## 2020-07-18 ENCOUNTER — Other Ambulatory Visit: Payer: Self-pay | Admitting: Surgery

## 2020-07-18 DIAGNOSIS — Z853 Personal history of malignant neoplasm of breast: Secondary | ICD-10-CM

## 2020-07-20 ENCOUNTER — Telehealth: Payer: Self-pay | Admitting: *Deleted

## 2020-07-20 ENCOUNTER — Encounter: Payer: Self-pay | Admitting: *Deleted

## 2020-07-20 ENCOUNTER — Telehealth: Payer: Self-pay | Admitting: Hematology and Oncology

## 2020-07-20 NOTE — Telephone Encounter (Signed)
Left message for a return phone call to follow up from [facility] last week.  

## 2020-07-20 NOTE — Telephone Encounter (Signed)
Scheduled appt per 10/27 sch msg - mailed reminder letter with apt date and time

## 2020-08-03 ENCOUNTER — Encounter (HOSPITAL_BASED_OUTPATIENT_CLINIC_OR_DEPARTMENT_OTHER): Payer: Self-pay | Admitting: Surgery

## 2020-08-03 ENCOUNTER — Other Ambulatory Visit: Payer: Self-pay

## 2020-08-07 ENCOUNTER — Other Ambulatory Visit (HOSPITAL_COMMUNITY)
Admission: RE | Admit: 2020-08-07 | Discharge: 2020-08-07 | Disposition: A | Discharge status: Home / Self Care | Payer: Medicare Other | Source: Ambulatory Visit | Attending: Surgery | Admitting: Surgery

## 2020-08-07 ENCOUNTER — Encounter (HOSPITAL_BASED_OUTPATIENT_CLINIC_OR_DEPARTMENT_OTHER)
Admission: RE | Admit: 2020-08-07 | Discharge: 2020-08-07 | Disposition: A | Discharge status: Home / Self Care | Payer: Medicare Other | Source: Ambulatory Visit | Attending: Surgery | Admitting: Surgery

## 2020-08-07 DIAGNOSIS — Z20822 Contact with and (suspected) exposure to covid-19: Secondary | ICD-10-CM | POA: Insufficient documentation

## 2020-08-07 DIAGNOSIS — Z01812 Encounter for preprocedural laboratory examination: Secondary | ICD-10-CM | POA: Diagnosis not present

## 2020-08-07 DIAGNOSIS — Z01810 Encounter for preprocedural cardiovascular examination: Secondary | ICD-10-CM | POA: Insufficient documentation

## 2020-08-07 LAB — SARS CORONAVIRUS 2 (TAT 6-24 HRS): SARS Coronavirus 2: NEGATIVE

## 2020-08-07 MED ORDER — ENSURE PRE-SURGERY PO LIQD: 296.0000 mL | Freq: Once | ORAL | Status: DC

## 2020-08-07 NOTE — Progress Notes (Signed)

## 2020-08-08 NOTE — Progress Notes (Signed)
Nutrition  Patient identified by attending Breast Clinic on 07/12/2020.  Patient was given nutrition packet by nurse navigator which included RD contact information.   Chart reviewed.   75 year old female with new diagnosis of breast cancer.  Planning lumpectomy on 11/18, followed by oncotype testing, radiation and antiestrogens.    Chart reviewed.   Ht: 63 inches Wt: 168 lb BMI: 30  Patient currently is not at risk for malnutrition.  Please contact RD if changes in nutritional status occur.  Christina [name], [place], [place] Registered Dietitian [phone] (mobile)

## 2020-08-09 ENCOUNTER — Ambulatory Visit
Admission: RE | Admit: 2020-08-09 | Discharge: 2020-08-09 | Disposition: A | Discharge status: Home / Self Care | Payer: Medicare Other | Source: Ambulatory Visit | Attending: Surgery | Admitting: Surgery

## 2020-08-09 ENCOUNTER — Other Ambulatory Visit: Payer: Self-pay

## 2020-08-09 DIAGNOSIS — Z853 Personal history of malignant neoplasm of breast: Secondary | ICD-10-CM

## 2020-08-09 NOTE — H&P (Signed)
Christina [name] Appointment: 07/12/2020 1:00 PM Location: [facility] Patient #: [id number] DOB: 04-01-1946 Undefined / Language: Cleophus Molt / Race: White Female   History of Present Illness Nathaneil Canary A. Ninfa Linden MD; 07/12/2020 2:30 PM) The patient is a 74 year old female who presents with breast cancer. Chief complaint: Invasive ductal carcinoma of the left breast  This is a 74 year old female who presented for mammography after palpating a right breast mass. The right breast was found to be normal and this area is resolved. Unfortunately, an abnormality was seen in the left breast measuring around 1 cm. This is at the 9 o'clock position of the breast. An ultrasound was performed which also showed a 3.4 cm dilated duct involving the area. A biopsy of this showed invasive ductal carcinoma which was ER/PR strongly positive. HER-2 was negative. The Ki-67 was 30%. She denies nipple discharge. There is no family history of breast cancer. She is otherwise without complaints.   Past Surgical History Conni Slipper, RN; 07/12/2020 8:29 AM) Breast Biopsy  Left. Thyroid Surgery  Tonsillectomy   Diagnostic Studies History Conni Slipper, RN; 07/12/2020 8:29 AM) Colonoscopy  >10 years ago Mammogram  within last year Pap Smear  >5 years ago  Medication History Conni Slipper, RN; 07/12/2020 8:20 AM) Medications Reconciled  Social History Conni Slipper, RN; 07/12/2020 8:29 AM) Alcohol use  Occasional alcohol use. Caffeine use  Carbonated beverages, Tea. No drug use  Tobacco use  Former smoker.  Family History Conni Slipper, RN; 07/12/2020 8:29 AM) Alcohol Abuse  Brother, Family Members In General. Arthritis  Brother, Family Members In General, Father, Mother. Depression  Family Members In General. Diabetes Mellitus  Family Members In General. Hypertension  Father, Mother. Prostate Cancer  Father.  Pregnancy / Birth History Conni Slipper, RN; 07/12/2020 8:29  AM) Age at menarche  47 years. Age of menopause  <44 Gravida  4 Irregular periods  Length (months) of breastfeeding  >3 Maternal age  31-30 Para  29  Other Problems Conni Slipper, RN; 07/12/2020 8:29 AM) Anxiety Disorder  Arthritis  Back Pain  Depression  Hemorrhoids  High blood pressure  Hypercholesterolemia  Thyroid Disease     Review of Systems Conni Slipper RN; 07/12/2020 8:29 AM) General Present- Fatigue. Not Present- Appetite Loss, Chills, Fever, Night Sweats, Weight Gain and Weight Loss. Skin Present- Change in Wart/Mole. Not Present- Dryness, Hives, Jaundice, New Lesions, Non-Healing Wounds, Rash and Ulcer. HEENT Present- Hearing Loss, Ringing in the Ears, Seasonal Allergies and Wears glasses/contact lenses. Not Present- Earache, Hoarseness, Nose Bleed, Oral Ulcers, Sinus Pain, Sore Throat, Visual Disturbances and Yellow Eyes. Respiratory Not Present- Bloody sputum, Chronic Cough, Difficulty Breathing, Snoring and Wheezing. Breast Not Present- Breast Mass, Breast Pain, Nipple Discharge and Skin Changes. Cardiovascular Present- Leg Cramps. Not Present- Chest Pain, Difficulty Breathing Lying Down, Palpitations, Rapid Heart Rate, Shortness of Breath and Swelling of Extremities. Gastrointestinal Present- Excessive gas and Hemorrhoids. Not Present- Abdominal Pain, Bloating, Bloody Stool, Change in Bowel Habits, Chronic diarrhea, Constipation, Difficulty Swallowing, Gets full quickly at meals, Indigestion, Nausea, Rectal Pain and Vomiting. Female Genitourinary Present- Frequency. Not Present- Nocturia, Painful Urination, Pelvic Pain and Urgency. Musculoskeletal Present- Back Pain, Muscle Pain, Muscle Weakness and Swelling of Extremities. Not Present- Joint Pain and Joint Stiffness. Neurological Present- Numbness and Trouble walking. Not Present- Decreased Memory, Fainting, Headaches, Seizures, Tingling, Tremor and Weakness. Psychiatric Present- Anxiety and Depression.  Not Present- Bipolar, Change in Sleep Pattern, Fearful and Frequent crying. Endocrine Present- Hair Changes. Not Present- Cold Intolerance, Excessive Hunger,  Heat Intolerance, Hot flashes and New Diabetes. Hematology Not Present- Blood Thinners, Easy Bruising, Excessive bleeding, Gland problems, HIV and Persistent Infections.   Physical Exam (Antwane Grose A. Ninfa Linden MD; 07/12/2020 2:31 PM) The physical exam findings are as follows: Note: She appears well on exam.  There are no palpable breast masses in either breast. She has ecchymosis from the biopsy site of the left breast. I can palpate a mass. The nipple areolar complex is normal. There is no axillary adenopathy.    Assessment & Plan (Anisah Kuck A. Ninfa Linden MD; 07/12/2020 2:33 PM)  INVASIVE DUCTAL CARCINOMA OF BREAST, LEFT (C50.912)  Impression: I have reviewed the patient's electronic medical records. I have reviewed her mammogram, ultrasound, and pathology results. She has a left breast invasive ductal carcinoma. This is also causing a duct to be dilated for a length of approximately 3.4 cm. We have discussed her at length in our multidisciplinary breast conference this morning. We then discussed options with the patient which included breast conservation versus mastectomy. She is interested in breast conservation. An MRI has been recommended to evaluate the extent of disease given the size of the tumor and the dilated duct to make sure there is not further extent of the cancer. I have discussed this with her in detail. The hope would be then to proceed with a radioactive seed guided left breast lumpectomy and sentinel lymph node biopsy. I discussed the reasons for surgery with her. We also discussed the reasonings to proceed with lymph node biopsy as well. I discussed the surgical procedure in detail. I discussed the risk which includes but is not limited to bleeding, infection, injury to surrounding structures, the need for further procedures if  the lymph nodes or margins are positive, cardiopulmonary issues, postoperative recovery, etc. She understands and wishes to proceed with surgery which will be scheduled.

## 2020-08-10 ENCOUNTER — Ambulatory Visit (HOSPITAL_BASED_OUTPATIENT_CLINIC_OR_DEPARTMENT_OTHER)
Admission: RE | Admit: 2020-08-10 | Discharge: 2020-08-10 | Disposition: A | Discharge status: Home / Self Care | Payer: Medicare Other | Attending: Surgery | Admitting: Surgery

## 2020-08-10 ENCOUNTER — Ambulatory Visit (HOSPITAL_BASED_OUTPATIENT_CLINIC_OR_DEPARTMENT_OTHER): Payer: Medicare Other | Admitting: Anesthesiology

## 2020-08-10 ENCOUNTER — Ambulatory Visit
Admission: RE | Admit: 2020-08-10 | Discharge: 2020-08-10 | Disposition: A | Discharge status: Home / Self Care | Payer: Medicare Other | Source: Ambulatory Visit | Attending: Surgery | Admitting: Surgery

## 2020-08-10 ENCOUNTER — Encounter (HOSPITAL_BASED_OUTPATIENT_CLINIC_OR_DEPARTMENT_OTHER): Payer: Self-pay | Admitting: Surgery

## 2020-08-10 ENCOUNTER — Encounter (HOSPITAL_BASED_OUTPATIENT_CLINIC_OR_DEPARTMENT_OTHER)
Admission: RE | Disposition: A | Discharge status: Home / Self Care | Payer: Self-pay | Source: Home / Self Care | Attending: Surgery

## 2020-08-10 ENCOUNTER — Other Ambulatory Visit: Payer: Self-pay

## 2020-08-10 ENCOUNTER — Ambulatory Visit (HOSPITAL_COMMUNITY)
Admission: RE | Admit: 2020-08-10 | Discharge: 2020-08-10 | Disposition: A | Discharge status: Home / Self Care | Payer: Medicare Other | Source: Ambulatory Visit | Attending: Surgery | Admitting: Surgery

## 2020-08-10 DIAGNOSIS — Z87891 Personal history of nicotine dependence: Secondary | ICD-10-CM | POA: Diagnosis not present

## 2020-08-10 DIAGNOSIS — Z8042 Family history of malignant neoplasm of prostate: Secondary | ICD-10-CM | POA: Insufficient documentation

## 2020-08-10 DIAGNOSIS — C50912 Malignant neoplasm of unspecified site of left female breast: Secondary | ICD-10-CM | POA: Diagnosis not present

## 2020-08-10 DIAGNOSIS — Z853 Personal history of malignant neoplasm of breast: Secondary | ICD-10-CM

## 2020-08-10 DIAGNOSIS — N6092 Unspecified benign mammary dysplasia of left breast: Secondary | ICD-10-CM | POA: Diagnosis not present

## 2020-08-10 HISTORY — DX: Hypothyroidism, unspecified: E03.9

## 2020-08-10 HISTORY — PX: BREAST LUMPECTOMY WITH RADIOACTIVE SEED AND SENTINEL LYMPH NODE BIOPSY: SHX6550

## 2020-08-10 SURGERY — BREAST LUMPECTOMY WITH RADIOACTIVE SEED AND SENTINEL LYMPH NODE BIOPSY
Anesthesia: General | Site: Breast | Laterality: Left

## 2020-08-10 MED ORDER — SODIUM CHLORIDE (PF) 0.9 % IJ SOLN
INTRAMUSCULAR | Status: AC
  Filled 2020-08-10: qty 10

## 2020-08-10 MED ORDER — PROPOFOL 10 MG/ML IV BOLUS
INTRAVENOUS | Status: AC
  Filled 2020-08-10: qty 20

## 2020-08-10 MED ORDER — FENTANYL CITRATE (PF) 100 MCG/2ML IJ SOLN
50.0000 ug | Freq: Once | INTRAMUSCULAR | Status: AC
  Administered 2020-08-10: 50 ug via INTRAVENOUS

## 2020-08-10 MED ORDER — ROPIVACAINE HCL 5 MG/ML IJ SOLN
INTRAMUSCULAR | Status: DC | PRN
  Administered 2020-08-10: 30 mL via PERINEURAL

## 2020-08-10 MED ORDER — EPHEDRINE SULFATE 50 MG/ML IJ SOLN
INTRAMUSCULAR | Status: DC | PRN
  Administered 2020-08-10: 10 mg via INTRAVENOUS
  Administered 2020-08-10: 5 mg via INTRAVENOUS

## 2020-08-10 MED ORDER — MIDAZOLAM HCL 2 MG/2ML IJ SOLN
INTRAMUSCULAR | Status: AC
  Filled 2020-08-10: qty 2

## 2020-08-10 MED ORDER — LIDOCAINE HCL (CARDIAC) PF 100 MG/5ML IV SOSY
PREFILLED_SYRINGE | INTRAVENOUS | Status: DC | PRN
  Administered 2020-08-10: 60 mg via INTRAVENOUS

## 2020-08-10 MED ORDER — TRAMADOL HCL 50 MG PO TABS
50.0000 mg | ORAL_TABLET | Freq: Four times a day (QID) | ORAL | 0 refills | Status: AC | PRN
Start: 2020-08-10 — End: ?

## 2020-08-10 MED ORDER — ONDANSETRON HCL 4 MG/2ML IJ SOLN
INTRAMUSCULAR | Status: DC | PRN
  Administered 2020-08-10: 4 mg via INTRAVENOUS

## 2020-08-10 MED ORDER — CHLORHEXIDINE GLUCONATE CLOTH 2 % EX PADS: 6.0000 | MEDICATED_PAD | Freq: Once | CUTANEOUS | Status: DC

## 2020-08-10 MED ORDER — FENTANYL CITRATE (PF) 100 MCG/2ML IJ SOLN
INTRAMUSCULAR | Status: AC
  Filled 2020-08-10: qty 2

## 2020-08-10 MED ORDER — OXYCODONE HCL 5 MG PO TABS: 5.0000 mg | ORAL_TABLET | Freq: Once | ORAL | Status: DC | PRN

## 2020-08-10 MED ORDER — LIDOCAINE 2% (20 MG/ML) 5 ML SYRINGE
INTRAMUSCULAR | Status: AC
  Filled 2020-08-10: qty 5

## 2020-08-10 MED ORDER — METHYLENE BLUE 0.5 % INJ SOLN
INTRAVENOUS | Status: AC
  Filled 2020-08-10: qty 10

## 2020-08-10 MED ORDER — DEXAMETHASONE SODIUM PHOSPHATE 10 MG/ML IJ SOLN
INTRAMUSCULAR | Status: AC
  Filled 2020-08-10: qty 1

## 2020-08-10 MED ORDER — PROPOFOL 10 MG/ML IV BOLUS
INTRAVENOUS | Status: DC | PRN
  Administered 2020-08-10: 150 mg via INTRAVENOUS

## 2020-08-10 MED ORDER — GABAPENTIN 300 MG PO CAPS: 300.0000 mg | ORAL_CAPSULE | ORAL | Status: DC

## 2020-08-10 MED ORDER — ACETAMINOPHEN 500 MG PO TABS
ORAL_TABLET | ORAL | Status: AC
  Filled 2020-08-10: qty 2

## 2020-08-10 MED ORDER — EPHEDRINE 5 MG/ML INJ
INTRAVENOUS | Status: AC
  Filled 2020-08-10: qty 10

## 2020-08-10 MED ORDER — ACETAMINOPHEN 500 MG PO TABS: 1000.0000 mg | ORAL_TABLET | ORAL | Status: DC

## 2020-08-10 MED ORDER — GABAPENTIN 300 MG PO CAPS
ORAL_CAPSULE | ORAL | Status: AC
  Filled 2020-08-10: qty 1

## 2020-08-10 MED ORDER — PROMETHAZINE HCL 25 MG/ML IJ SOLN: 6.2500 mg | INTRAMUSCULAR | Status: DC | PRN

## 2020-08-10 MED ORDER — OXYCODONE HCL 5 MG/5ML PO SOLN: 5.0000 mg | Freq: Once | ORAL | Status: DC | PRN

## 2020-08-10 MED ORDER — MIDAZOLAM HCL 2 MG/2ML IJ SOLN
1.0000 mg | Freq: Once | INTRAMUSCULAR | Status: AC
  Administered 2020-08-10: 1 mg via INTRAVENOUS

## 2020-08-10 MED ORDER — LACTATED RINGERS IV SOLN: INTRAVENOUS | Status: DC

## 2020-08-10 MED ORDER — DEXAMETHASONE SODIUM PHOSPHATE 10 MG/ML IJ SOLN
INTRAMUSCULAR | Status: DC | PRN
  Administered 2020-08-10: 5 mg via INTRAVENOUS

## 2020-08-10 MED ORDER — CEFAZOLIN SODIUM-DEXTROSE 2-4 GM/100ML-% IV SOLN
INTRAVENOUS | Status: AC
  Filled 2020-08-10: qty 100

## 2020-08-10 MED ORDER — BUPIVACAINE HCL 0.25 % IJ SOLN
INTRAMUSCULAR | Status: DC | PRN
  Administered 2020-08-10: 20 mL

## 2020-08-10 MED ORDER — BUPIVACAINE HCL (PF) 0.25 % IJ SOLN
INTRAMUSCULAR | Status: AC
  Filled 2020-08-10: qty 30

## 2020-08-10 MED ORDER — AMISULPRIDE (ANTIEMETIC) 5 MG/2ML IV SOLN: 10.0000 mg | Freq: Once | INTRAVENOUS | Status: DC | PRN

## 2020-08-10 MED ORDER — TECHNETIUM TC 99M TILMANOCEPT KIT
1.0000 | PACK | Freq: Once | INTRAVENOUS | Status: AC | PRN
  Administered 2020-08-10: 1 via INTRADERMAL

## 2020-08-10 MED ORDER — HYDRALAZINE HCL 20 MG/ML IJ SOLN
INTRAMUSCULAR | Status: DC | PRN
  Administered 2020-08-10: 5 mg via INTRAVENOUS

## 2020-08-10 MED ORDER — ONDANSETRON HCL 4 MG/2ML IJ SOLN
INTRAMUSCULAR | Status: AC
  Filled 2020-08-10: qty 2

## 2020-08-10 MED ORDER — CEFAZOLIN SODIUM-DEXTROSE 2-4 GM/100ML-% IV SOLN
2.0000 g | INTRAVENOUS | Status: AC
  Administered 2020-08-10: 2 g via INTRAVENOUS

## 2020-08-10 MED ORDER — HYDROMORPHONE HCL 1 MG/ML IJ SOLN: 0.2500 mg | INTRAMUSCULAR | Status: DC | PRN

## 2020-08-10 SURGICAL SUPPLY — 44 items
ADH SKN CLS APL DERMABOND .7 (GAUZE/BANDAGES/DRESSINGS) ×1
APL PRP STRL LF DISP 70% ISPRP (MISCELLANEOUS) ×1
APPLIER CLIP 9.375 MED OPEN (MISCELLANEOUS) ×3
APR CLP MED 9.3 20 MLT OPN (MISCELLANEOUS) ×1
BLADE SURG 15 STRL LF DISP TIS (BLADE) ×1 IMPLANT
BLADE SURG 15 STRL SS (BLADE) ×3
CHLORAPREP W/TINT 26 (MISCELLANEOUS) ×3 IMPLANT
CLIP APPLIE 9.375 MED OPEN (MISCELLANEOUS) ×1 IMPLANT
COVER BACK TABLE 60X90IN (DRAPES) ×3 IMPLANT
COVER MAYO STAND STRL (DRAPES) ×3 IMPLANT
COVER PROBE W GEL 5X96 (DRAPES) ×3 IMPLANT
DERMABOND ADVANCED (GAUZE/BANDAGES/DRESSINGS) ×2
DERMABOND ADVANCED .7 DNX12 (GAUZE/BANDAGES/DRESSINGS) ×1 IMPLANT
DRAPE LAPAROSCOPIC ABDOMINAL (DRAPES) ×3 IMPLANT
DRAPE UTILITY XL STRL (DRAPES) ×3 IMPLANT
ELECT REM PT RETURN 9FT ADLT (ELECTROSURGICAL) ×3
ELECTRODE REM PT RTRN 9FT ADLT (ELECTROSURGICAL) ×1 IMPLANT
GLOVE BIO SURGEON STRL SZ 6.5 (GLOVE) ×1 IMPLANT
GLOVE BIO SURGEONS STRL SZ 6.5 (GLOVE) ×1
GLOVE SURG SIGNA 7.5 PF LTX (GLOVE) ×3 IMPLANT
GOWN STRL REUS W/ TWL LRG LVL3 (GOWN DISPOSABLE) ×1 IMPLANT
GOWN STRL REUS W/ TWL XL LVL3 (GOWN DISPOSABLE) ×1 IMPLANT
GOWN STRL REUS W/TWL LRG LVL3 (GOWN DISPOSABLE) ×3
GOWN STRL REUS W/TWL XL LVL3 (GOWN DISPOSABLE) ×3
KIT MARKER MARGIN INK (KITS) ×3 IMPLANT
NDL HYPO 25X1 1.5 SAFETY (NEEDLE) ×1 IMPLANT
NDL SAFETY ECLIPSE 18X1.5 (NEEDLE) ×1 IMPLANT
NEEDLE HYPO 18GX1.5 SHARP (NEEDLE) ×3
NEEDLE HYPO 25X1 1.5 SAFETY (NEEDLE) ×3 IMPLANT
NS IRRIG 1000ML POUR BTL (IV SOLUTION) ×3 IMPLANT
PACK BASIN DAY SURGERY FS (CUSTOM PROCEDURE TRAY) ×3 IMPLANT
PENCIL SMOKE EVACUATOR (MISCELLANEOUS) ×3 IMPLANT
SLEEVE SCD COMPRESS KNEE MED (MISCELLANEOUS) ×3 IMPLANT
SPONGE LAP 4X18 RFD (DISPOSABLE) ×3 IMPLANT
SUT MNCRL AB 4-0 PS2 18 (SUTURE) ×3 IMPLANT
SUT SILK 2 0 SH (SUTURE) IMPLANT
SUT VIC AB 3-0 SH 27 (SUTURE) ×3
SUT VIC AB 3-0 SH 27X BRD (SUTURE) ×1 IMPLANT
SYR CONTROL 10ML LL (SYRINGE) ×3 IMPLANT
TOWEL GREEN STERILE FF (TOWEL DISPOSABLE) ×3 IMPLANT
TRAY FAXITRON CT DISP (TRAY / TRAY PROCEDURE) ×3 IMPLANT
TUBE CONNECTING 20'X1/4 (TUBING)
TUBE CONNECTING 20X1/4 (TUBING) IMPLANT
YANKAUER SUCT BULB TIP NO VENT (SUCTIONS) IMPLANT

## 2020-08-10 NOTE — Transfer of Care (Signed)
Immediate Anesthesia Transfer of Care Note  Patient: Christina [name]  Procedure(s) Performed: LEFT BREAST LUMPECTOMY WITH RADIOACTIVE SEED AND SENTINEL LYMPH NODE BIOPSY (Left Breast)  Patient Location: PACU  Anesthesia Type:GA combined with regional for post-op pain  Level of Consciousness: awake, alert  and oriented  Airway & Oxygen Therapy: Patient Spontanous Breathing and Patient connected to face mask oxygen  Post-op Assessment: Report given to RN and Post -op Vital signs reviewed and stable  Post vital signs: Reviewed and stable  Last Vitals:  Vitals Value Taken Time  BP 154/99 08/10/20 1439  Temp    Pulse 73 08/10/20 1443  Resp 15 08/10/20 1443  SpO2 98 % 08/10/20 1443    Last Pain:  Vitals:   08/10/20 1201  TempSrc: Oral  PainSc: 0-No pain      Patients Stated Pain Goal: 2 (20/10/07 1219)  Complications: No complications documented.

## 2020-08-10 NOTE — Interval H&P Note (Signed)
History and Physical Interval Note: no change in H and P  08/10/2020 11:52 AM  Christina [name]  has presented today for surgery, with the diagnosis of LEFT BREAST CANCER.  The various methods of treatment have been discussed with the patient and family. After consideration of risks, benefits and other options for treatment, the patient has consented to  Procedure(s): LEFT BREAST LUMPECTOMY WITH RADIOACTIVE SEED AND SENTINEL LYMPH NODE BIOPSY (Left) as a surgical intervention.  The patient's history has been reviewed, patient examined, no change in status, stable for surgery.  I have reviewed the patient's chart and labs.  Questions were answered to the patient's satisfaction.     Coralie Keens

## 2020-08-10 NOTE — Progress Notes (Signed)
Emotional support during breast injections °

## 2020-08-10 NOTE — Progress Notes (Signed)
Assisted Dr. Miller with left, ultrasound guided, pectoralis block. Side rails up, monitors on throughout procedure. See vital signs in flow sheet. Tolerated Procedure well. 

## 2020-08-10 NOTE — Anesthesia Procedure Notes (Signed)
Anesthesia Regional Block: Pectoralis block   Pre-Anesthetic Checklist: ,, timeout performed, Correct Patient, Correct Site, Correct Laterality, Correct Procedure, Correct Position, site marked, Risks and benefits discussed,  Surgical consent,  Pre-op evaluation,  At surgeon's request and post-op pain management  Laterality: Left  Prep: chloraprep       Needles:  Injection technique: Single-shot  Needle Type: Stimiplex     Needle Length: 9cm  Needle Gauge: 21     Additional Needles:   Procedures:,,,, ultrasound used (permanent image in chart),,,,  Narrative:  Start time: 08/10/2020 1:15 PM End time: 08/10/2020 1:20 PM Injection made incrementally with aspirations every 5 mL.  Performed by: Personally  Anesthesiologist: Lynda Rainwater, MD

## 2020-08-10 NOTE — Op Note (Signed)
   Christina [name] 08/10/2020   Pre-op Diagnosis: LEFT BREAST CANCER     Post-op Diagnosis: same  Procedure(s): LEFT BREAST LUMPECTOMY WITH RADIOACTIVE SEED AND DEEP AXILLARY SENTINEL LYMPH NODE BIOPSY  Surgeon(s): Coralie Keens, MD  Anesthesia: General  Staff:  Circulator: Izora Ribas, RN Scrub Person: Lorenza Burton, CST  Estimated Blood Loss: Minimal               Specimens: sent to path  Indications: This is a 74 year old female was found to have an abnormality with a small mass and dilated ducts in the inner left breast.  She underwent a biopsy showing invasive ductal carcinoma.  On MRI the mass was about 1 cm in size but the area of reaction was 3.4 cm in size the decision was made to proceed with a lumpectomy and sentinel lymph node biopsy  Procedure: The patient was brought to the operating room and identifies correct patient.  She is placed upon the operating table general anesthesia was induced.  Her left breast and axilla were prepped and draped in the usual sterile fashion.  The radioactive seed was located in the medial left breast at the 9 o'clock position 7 cm from the nipple.  I anesthetized the skin over the area of the seed which was identified of the neoprobe with Marcaine.  I then made a longitudinal incision on the breast with a scalpel.  I then dissected down to the breast tissue with electrocautery.  With the aid of the neoprobe I then performed a wide lumpectomy going down to the chest wall.  I stayed widely around the seed in all directions.  Once the specimen was removed, I marked the margins with paint and then x-rayed the specimen.  X-ray confirmed the radioactive seed and previous biopsy marker in the specimen.  The lumpectomy specimen sent to pathology for evaluation.  I next identified an area of increased uptake in the left axilla.  I anesthetized skin with Marcaine made incision with a scalpel.  I then dissected down into the deep left  axillary tissue.  A deep left axillary lymph node was identified and excised with the cautery.  I then evaluate the nodal basin and no further increased uptake was identified.  There were no enlarged palpable nodes either.  At this point hemostasis was achieved to both incisions.  I anesthetized the breast incision further with Marcaine.  I placed surgical clips around the periphery of the lumpectomy cavity.  I then closed both incisions with interrupted 3-0 Vicryl sutures in the subcutaneous tissue and then skin with running 4-0 Monocryl.  Dermabond and a breast binder were then applied.  The patient tolerated the procedure well.  All the counts were correct at the end of the procedure.  The patient was then extubated in the operating room and taken in stable condition to the recovery room.          Coralie Keens   Date: 08/10/2020  Time: 2:32 PM

## 2020-08-10 NOTE — Anesthesia Postprocedure Evaluation (Signed)
Anesthesia Post Note  Patient: Christina [name]  Procedure(s) Performed: LEFT BREAST LUMPECTOMY WITH RADIOACTIVE SEED AND SENTINEL LYMPH NODE BIOPSY (Left Breast)     Patient location during evaluation: PACU Anesthesia Type: General Level of consciousness: awake and alert Pain management: pain level controlled Vital Signs Assessment: post-procedure vital signs reviewed and stable Respiratory status: spontaneous breathing, nonlabored ventilation and respiratory function stable Cardiovascular status: blood pressure returned to baseline and stable Postop Assessment: no apparent nausea or vomiting Anesthetic complications: no   No complications documented.  Last Vitals:  Vitals:   08/10/20 1513 08/10/20 1552  BP: (!) 154/90 (!) 168/104  Pulse: 78 87  Resp: (!) 24 16  Temp:  36.4 C  SpO2: 98% 97%    Last Pain:  Vitals:   08/10/20 1552  TempSrc: Oral  PainSc: 0-No pain                 Lynda Rainwater

## 2020-08-10 NOTE — Anesthesia Preprocedure Evaluation (Signed)
Anesthesia Evaluation  Patient identified by MRN, date of birth, ID band Patient awake    Reviewed: Allergy & Precautions, NPO status , Patient's Chart, lab work & pertinent test results  Airway Mallampati: II  TM Distance: >3 FB Neck ROM: Full    Dental no notable dental hx.    Pulmonary neg pulmonary ROS,    Pulmonary exam normal breath sounds clear to auscultation       Cardiovascular hypertension, Pt. on medications negative cardio ROS Normal cardiovascular exam Rhythm:Regular Rate:Normal     Neuro/Psych Anxiety Depression negative neurological ROS  negative psych ROS   GI/Hepatic negative GI ROS, Neg liver ROS,   Endo/Other  Hypothyroidism   Renal/GU negative Renal ROS  negative genitourinary   Musculoskeletal  (+) Arthritis , Osteoarthritis,    Abdominal (+) + obese,   Peds negative pediatric ROS (+)  Hematology negative hematology ROS (+)   Anesthesia Other Findings Breast Cancer  Reproductive/Obstetrics negative OB ROS                             Anesthesia Physical Anesthesia Plan  ASA: III  Anesthesia Plan: General   Post-op Pain Management:  Regional for Post-op pain   Induction: Intravenous  PONV Risk Score and Plan: 3 and Ondansetron, Dexamethasone, Midazolam and Treatment may vary due to age or medical condition  Airway Management Planned: LMA  Additional Equipment:   Intra-op Plan:   Post-operative Plan: Extubation in OR  Informed Consent: I have reviewed the patients History and Physical, chart, labs and discussed the procedure including the risks, benefits and alternatives for the proposed anesthesia with the patient or authorized representative who has indicated his/her understanding and acceptance.     Dental advisory given  Plan Discussed with: CRNA  Anesthesia Plan Comments:         Anesthesia Quick Evaluation

## 2020-08-10 NOTE — Discharge Instructions (Signed)
Post Anesthesia Home Care Instructions  Activity: Get plenty of rest for the remainder of the day. A responsible individual must stay with you for 24 hours following the procedure.  For the next 24 hours, DO NOT: -Drive a car -Paediatric nurse -Drink alcoholic beverages -Take any medication unless instructed by your physician -Make any legal decisions or sign important papers.  Meals: Start with liquid foods such as gelatin or soup. Progress to regular foods as tolerated. Avoid greasy, spicy, heavy foods. If nausea and/or vomiting occur, drink only clear liquids until the nausea and/or vomiting subsides. Call your physician if vomiting continues.  Special Instructions/Symptoms: Your throat may feel dry or sore from the anesthesia or the breathing tube placed in your throat during surgery. If this causes discomfort, gargle with warm salt water. The discomfort should disappear within 24 hours.  If you had a scopolamine patch placed behind your ear for the management of post- operative nausea and/or vomiting:  1. The medication in the patch is effective for 72 hours, after which it should be removed.  Wrap patch in a tissue and discard in the trash. Wash hands thoroughly with soap and water. 2. You may remove the patch earlier than 72 hours if you experience unpleasant side effects which may include dry mouth, dizziness or visual disturbances. 3. Avoid touching the patch. Wash your hands with soap and water after contact with the patch.       [place] Office Phone Number [phone]  BREAST BIOPSY/ PARTIAL MASTECTOMY: POST OP INSTRUCTIONS  Always review your discharge instruction sheet given to you by the facility where your surgery was performed.  IF YOU HAVE DISABILITY OR FAMILY LEAVE FORMS, YOU MUST BRING THEM TO THE OFFICE FOR PROCESSING.  DO NOT GIVE THEM TO YOUR DOCTOR.  1. A prescription for pain medication may be given to you upon discharge.  Take your  pain medication as prescribed, if needed.  If narcotic pain medicine is not needed, then you may take acetaminophen (Tylenol) or ibuprofen (Advil) as needed. 2. Take your usually prescribed medications unless otherwise directed 3. If you need a refill on your pain medication, please contact your pharmacy.  They will contact our office to request authorization.  Prescriptions will not be filled after 5pm or on week-ends. 4. You should eat very light the first 24 hours after surgery, such as soup, crackers, pudding, etc.  Resume your normal diet the day after surgery. 5. Most patients will experience some swelling and bruising in the breast.  Ice packs and a good support bra will help.  Swelling and bruising can take several days to resolve.  6. It is common to experience some constipation if taking pain medication after surgery.  Increasing fluid intake and taking a stool softener will usually help or prevent this problem from occurring.  A mild laxative (Milk of Magnesia or Miralax) should be taken according to package directions if there are no bowel movements after 48 hours. 7. Unless discharge instructions indicate otherwise, you may remove your bandages 24-48 hours after surgery, and you may shower at that time.  You may have steri-strips (small skin tapes) in place directly over the incision.  These strips should be left on the skin for 7-10 days.  If your surgeon used skin glue on the incision, you may shower in 24 hours.  The glue will flake off over the next 2-3 weeks.  Any sutures or staples will be removed at the office during your follow-up visit.  8. ACTIVITIES:  You may resume regular daily activities (gradually increasing) beginning the next day.  Wearing a good support bra or sports bra minimizes pain and swelling.  You may have sexual intercourse when it is comfortable. a. You may drive when you no longer are taking prescription pain medication, you can comfortably wear a seatbelt, and you can  safely maneuver your car and apply brakes. b. RETURN TO WORK:  ______________________________________________________________________________________ 9. You should see your doctor in the office for a follow-up appointment approximately two weeks after your surgery.  Your doctor's nurse will typically make your follow-up appointment when she calls you with your pathology report.  Expect your pathology report 2-3 business days after your surgery.  You may call to check if you do not hear from [place] after three days. 10. OTHER INSTRUCTIONS:OK TO REMOVE THE BINDER AND SHOWER STARTING TOMORROW 11. ICE PACK, IBUPROFEN FOR PAIN 12. NO VIGOROUS ACTIVITY FOR ONE WEEK _______________________________________________________________________________________________ _____________________________________________________________________________________________________________________________________ _____________________________________________________________________________________________________________________________________ _____________________________________________________________________________________________________________________________________  WHEN TO CALL YOUR DOCTOR: 1. Fever over 101.0 2. Nausea and/or vomiting. 3. Extreme swelling or bruising. 4. Continued bleeding from incision. 5. Increased pain, redness, or drainage from the incision.  The clinic staff is available to answer your questions during regular business hours.  Please don't hesitate to call and ask to speak to one of the nurses for clinical concerns.  If you have a medical emergency, go to the nearest emergency room or call 911.  A surgeon from [facility] is always on call at the hospital.  For further questions, please visit [email or web address]

## 2020-08-10 NOTE — Anesthesia Procedure Notes (Signed)
Procedure Name: LMA Insertion Date/Time: 08/10/2020 1:54 PM Performed by: Lavonia Dana, CRNA Pre-anesthesia Checklist: Patient identified, Emergency Drugs available, Suction available and Patient being monitored Patient Re-evaluated:Patient Re-evaluated prior to induction Oxygen Delivery Method: Circle system utilized Preoxygenation: Pre-oxygenation with 100% oxygen Induction Type: IV induction Ventilation: Mask ventilation without difficulty LMA: LMA inserted LMA Size: 4.0 Number of attempts: 1 Airway Equipment and Method: Bite block Placement Confirmation: positive ETCO2 Tube secured with: Tape Dental Injury: Teeth and Oropharynx as per pre-operative assessment

## 2020-08-11 ENCOUNTER — Encounter (HOSPITAL_BASED_OUTPATIENT_CLINIC_OR_DEPARTMENT_OTHER): Payer: Self-pay | Admitting: Surgery

## 2020-08-14 LAB — SURGICAL PATHOLOGY

## 2020-08-15 ENCOUNTER — Encounter: Payer: Self-pay | Admitting: *Deleted

## 2020-08-15 DIAGNOSIS — Z170 Estrogen receptor positive status [ER+]: Secondary | ICD-10-CM

## 2020-08-15 DIAGNOSIS — C50212 Malignant neoplasm of upper-inner quadrant of left female breast: Secondary | ICD-10-CM

## 2020-08-20 NOTE — Assessment & Plan Note (Deleted)
07/06/2020: Patient's physician palpated a right breast mass. Mammogram showed no right breast abnormalities, and a 1.0cm left breast mass at the 9 o'clock position with a focally dilated duct, 3.4cm, and no suspicious axillary adenopathy. Biopsy showed IDC, grade 2, HER-2 negative (0), ER/PR+ 100%, Ki67 30%.  08/10/20: Left lumpectomy Ninfa Linden): IDC, grade 2, 0.9cm, one left axillary lymph node negative for carcinoma. ER/PR+ 100%, Ki67 30%  Pathology counseling: I discussed the final pathology report of the patient provided  a copy of this report. I discussed the margins as well as lymph node surgeries. We also discussed the final staging along with previously performed ER/PR and HER-2/neu testing.  Treatment Plan: 1. Adjuvant radiation therapy followed by 2. Adjuvant antiestrogen therapy  RTC at end of radiation

## 2020-08-21 ENCOUNTER — Inpatient Hospital Stay: Payer: Medicare Other | Attending: Hematology and Oncology | Admitting: Hematology and Oncology

## 2020-08-28 ENCOUNTER — Encounter: Payer: Self-pay | Admitting: *Deleted

## 2020-08-28 ENCOUNTER — Telehealth: Payer: Self-pay | Admitting: *Deleted

## 2020-08-28 MED ORDER — ANASTROZOLE 1 MG PO TABS: 1.0000 mg | ORAL_TABLET | Freq: Every day | ORAL | 3 refills | Status: DC

## 2020-08-28 NOTE — Telephone Encounter (Signed)
Left vm for pt to return call regarding anastrozole. Contact information provided.

## 2020-09-04 ENCOUNTER — Ambulatory Visit: Payer: Medicare Other | Attending: Surgery | Admitting: Physical Therapy

## 2020-11-26 NOTE — Assessment & Plan Note (Addendum)
07/06/2020: Patient's physician palpated a right breast mass. Mammogram showed no right breast abnormalities, and a 1.0cm left breast mass at the 9 o'clock position with a focally dilated duct, 3.4cm, and no suspicious axillary adenopathy. Biopsy showed IDC, grade 2, HER-2 negative (0), ER/PR+ 100%, Ki67 30%.  08/10/2020:Left lumpectomy Christina [name]): IDC, grade 2, 0.9cm, one left axillary lymph node negative for carcinoma.   Recommendations:  Adjuvant antiestrogen therapy with anastrozole 1 mg daily

## 2020-11-27 ENCOUNTER — Other Ambulatory Visit: Payer: Self-pay

## 2020-11-27 ENCOUNTER — Telehealth: Payer: Self-pay | Admitting: Hematology and Oncology

## 2020-11-27 ENCOUNTER — Inpatient Hospital Stay: Payer: Medicare Other | Attending: Hematology and Oncology | Admitting: Hematology and Oncology

## 2020-11-27 DIAGNOSIS — C50212 Malignant neoplasm of upper-inner quadrant of left female breast: Secondary | ICD-10-CM | POA: Insufficient documentation

## 2020-11-27 DIAGNOSIS — K219 Gastro-esophageal reflux disease without esophagitis: Secondary | ICD-10-CM | POA: Diagnosis not present

## 2020-11-27 DIAGNOSIS — Z170 Estrogen receptor positive status [ER+]: Secondary | ICD-10-CM | POA: Diagnosis not present

## 2020-11-27 DIAGNOSIS — Z79899 Other long term (current) drug therapy: Secondary | ICD-10-CM | POA: Diagnosis not present

## 2020-11-27 NOTE — Telephone Encounter (Signed)
Scheduled appts per 3/7 los. Gave pt a print out of AVS.

## 2020-11-27 NOTE — Progress Notes (Signed)
Patient Care Team: Lawerance Cruel, MD as PCP - General (Family Medicine) Mauro Kaufmann, RN as Oncology Nurse Navigator Rockwell Germany, RN as Oncology Nurse Navigator  DIAGNOSIS:    ICD-10-CM   1. Malignant neoplasm of upper-inner quadrant of left breast in female, estrogen receptor positive ([place])  C50.212 MM DIAG BREAST TOMO BILATERAL   Z17.0     SUMMARY OF ONCOLOGIC HISTORY: Oncology History  Malignant neoplasm of upper-inner quadrant of left breast in female, estrogen receptor positive ([place])  07/06/2020 Initial Diagnosis   Patient's physician palpated a right breast mass. Mammogram showed no right breast abnormalities, and a 1.0cm left breast mass at the 9 o'clock position with a focally dilated duct, 3.4cm, and no suspicious axillary adenopathy. Biopsy showed IDC, grade 2, HER-2 negative (0), ER/PR+ 100%, Ki67 30%.   07/12/2020 Cancer Staging   Staging form: Breast, AJCC 8th Edition - Clinical stage from 07/12/2020: Stage IA (cT1b, cN0, cM0, G2, ER+, PR+, HER2-) - Signed by Nicholas Lose, MD on 07/12/2020   08/10/2020 Surgery   Left lumpectomy Ninfa Linden): IDC, grade 2, 0.9cm, one left axillary lymph node negative for carcinoma.     CHIEF COMPLIANT: Follow-up of left breast cancer on anastrozole  INTERVAL HISTORY: Christina [name] is a 75 y.o. with above-mentioned history of left breast cancer. She underwent a left lumpectomy and is currently on antiestrogen therapy with anastrozole. She presents to the clinic today for follow-up.  She is tolerating anastrozole extremely well without any problems or concerns.  ALLERGIES:  is allergic to peanut-containing drug products, nsaids, pork-derived products, synthroid [levothyroxine sodium], tylenol [acetaminophen], and valium [diazepam].  MEDICATIONS:  Current Outpatient Medications  Medication Sig Dispense Refill  . anastrozole (ARIMIDEX) 1 MG tablet Take 1 tablet (1 mg total) by mouth daily. 90 tablet 3  . beta  carotene 15 MG capsule Take 15 mg by mouth daily. Reported on 12/20/2015    . CELEXA 10 MG tablet Take 1 tablet (10 mg total) by mouth 2 (two) times daily. 180 tablet 1  . cholecalciferol (VITAMIN D) 1000 UNITS tablet Take 5,000 Units by mouth daily.     . Cyanocobalamin (B-12 IJ) Inject as directed as directed.    . folic acid (FOLVITE) 889 MCG tablet Take 800 mcg by mouth daily. Reported on 12/20/2015    . Lactobacillus-Inulin (CULTURELLE DIGESTIVE HEALTH PO) Take 1 capsule by mouth. Reported on 12/20/2015    . Lecithin 1200 MG CAPS Take 1 capsule by mouth daily.    Marland Kitchen losartan (COZAAR) 50 MG tablet Take 50 mg by mouth daily.    . Melatonin 3 MG CAPS Take 5 capsules by mouth daily.     Marland Kitchen thiamine (VITAMIN B-1) 100 MG tablet Take 100 mg by mouth daily. Reported on 12/20/2015    . THYROID PO Take 1 capsule by mouth.    . traMADol (ULTRAM) 50 MG tablet Take 1 tablet (50 mg total) by mouth every 6 (six) hours as needed for moderate pain or severe pain. 25 tablet 0  . UNABLE TO FIND Take 1 tablet by mouth daily. Med Name: Ginko biloba    . vitamin A 10000 UNIT capsule Take 10,000 Units by mouth daily.     No current facility-administered medications for this visit.    PHYSICAL EXAMINATION: ECOG PERFORMANCE STATUS: 1 - Symptomatic but completely ambulatory  Vitals:   11/27/20 1533  BP: (!) 146/91  Pulse: 94  Resp: 18  Temp: 98.1 F (36.7 C)  SpO2: 96%  Filed Weights   11/27/20 1533  Weight: 175 lb 3.2 oz (79.5 kg)    BREAST: No palpable masses or nodules in either right or left breasts. No palpable axillary supraclavicular or infraclavicular adenopathy no breast tenderness or nipple discharge. (exam performed in the presence of a chaperone)  LABORATORY DATA:  I have reviewed the data as listed CMP Latest Ref Rng & Units 07/12/2020 05/22/2012  Glucose 70 - 99 mg/dL 92 90  BUN 8 - 23 mg/dL 10 7  Creatinine 0.44 - 1.00 mg/dL 0.81 0.7  Sodium 135 - 145 mmol/L 139 140  Potassium 3.5 -  5.1 mmol/L 3.6 3.7  Chloride 98 - 111 mmol/L 104 103  CO2 22 - 32 mmol/L 25 29  Calcium 8.9 - 10.3 mg/dL 9.1 9.0  Total Protein 6.5 - 8.1 g/dL 6.9 6.4  Total Bilirubin 0.3 - 1.2 mg/dL 0.6 0.5  Alkaline Phos 38 - 126 U/L 90 64  AST 15 - 41 U/L 16 18  ALT 0 - 44 U/L 18 14    Lab Results  Component Value Date   WBC 6.8 07/12/2020   HGB 14.0 07/12/2020   HCT 41.6 07/12/2020   MCV 86.5 07/12/2020   PLT 324 07/12/2020   NEUTROABS 4.2 07/12/2020    ASSESSMENT & PLAN:  Malignant neoplasm of upper-inner quadrant of left breast in female, estrogen receptor positive ([place]) 07/06/2020: Patient's physician palpated a right breast mass. Mammogram showed no right breast abnormalities, and a 1.0cm left breast mass at the 9 o'clock position with a focally dilated duct, 3.4cm, and no suspicious axillary adenopathy. Biopsy showed IDC, grade 2, HER-2 negative (0), ER/PR+ 100%, Ki67 30%.  08/10/2020:Left lumpectomy Ninfa Linden): IDC, grade 2, 0.9cm, one left axillary lymph node negative for carcinoma.  Did not undergo radiation given her age and GERD prognostic markers.  Recommendations:  Adjuvant antiestrogen therapy with anastrozole 1 mg daily Anastrozole toxicities: Tolerating it extremely well without any side effects.  Breast cancer surveillance: Mammograms will be done in September 2022 Return to clinic in 1 year for follow-up  Orders Placed This Encounter  Procedures  . MM DIAG BREAST TOMO BILATERAL    Standing Status:   Future    Standing Expiration Date:   11/27/2021    Order Specific Question:   Reason for Exam (SYMPTOM  OR DIAGNOSIS REQUIRED)    Answer:   Annual mammograms with H/O breast cancer    Order Specific Question:   Preferred imaging location?    Answer:   [facility]    Order Specific Question:   Release to patient    Answer:   Immediate   The patient has a good understanding of the overall plan. she agrees with it. she will call with any problems that may develop  before the next visit here.  Total time spent: 20 mins including face to face time and time spent for planning, charting and coordination of care  Rulon Eisenmenger, MD, MPH 11/27/2020  I, Cloyde Reams Dorshimer, am acting as scribe for Dr. Nicholas Lose.  I have reviewed the above documentation for accuracy and completeness, and I agree with the above.

## 2020-12-07 IMAGING — US US BREAST*L* LIMITED INC AXILLA
1 series · 12 of 12 positions shown · non-contrast
Comparison: None.

CLINICAL DATA: 74-year-old female with a physician palpated right
breast lump.

EXAM:
DIGITAL DIAGNOSTIC BILATERAL MAMMOGRAM WITH CAD AND TOMO
ULTRASOUND BILATERAL BREAST

[Series 1: us breast*left* limited inc axilla · 0.06mm/px · 12 of 12 slices shown]
[im 1/12]
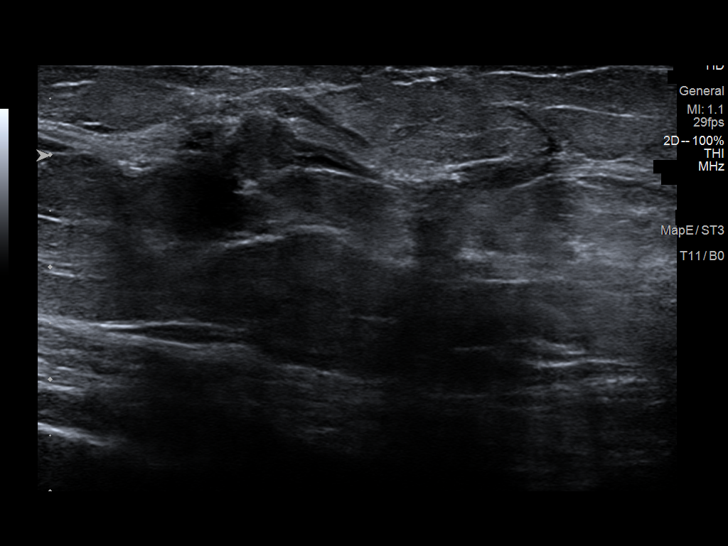
[im 2/12]
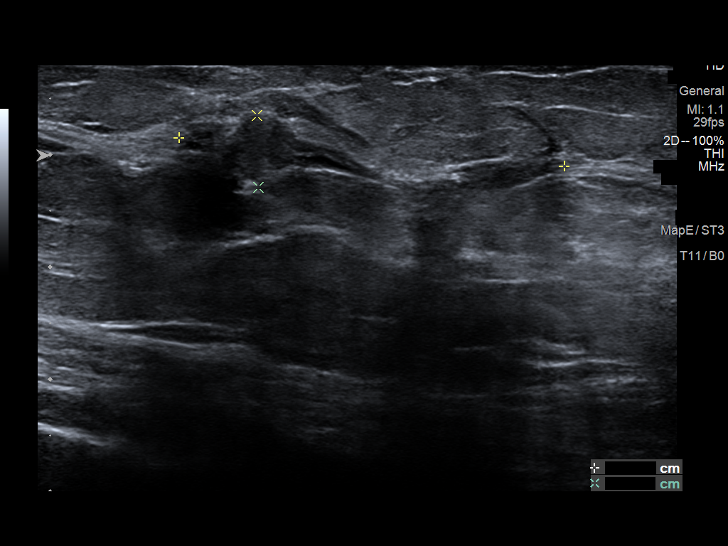
[im 3/12]
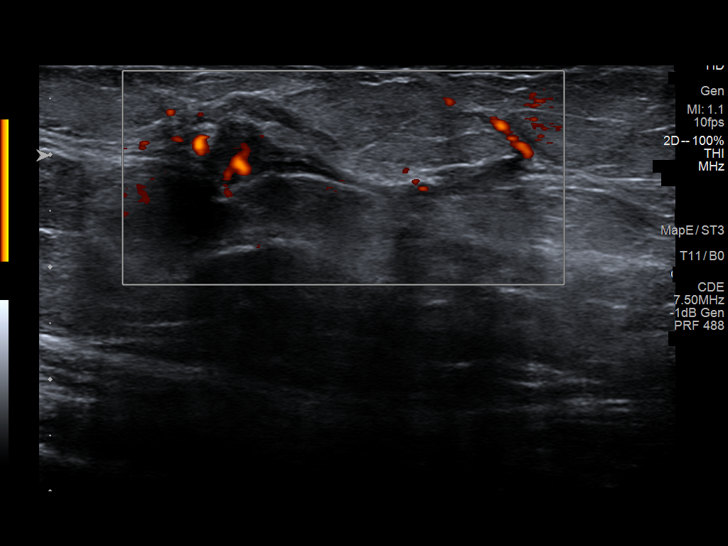
[im 4/12]
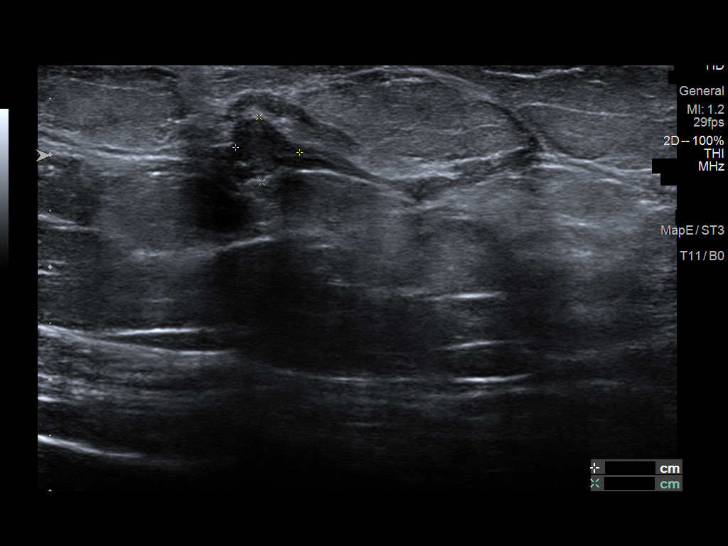
[im 5/12]
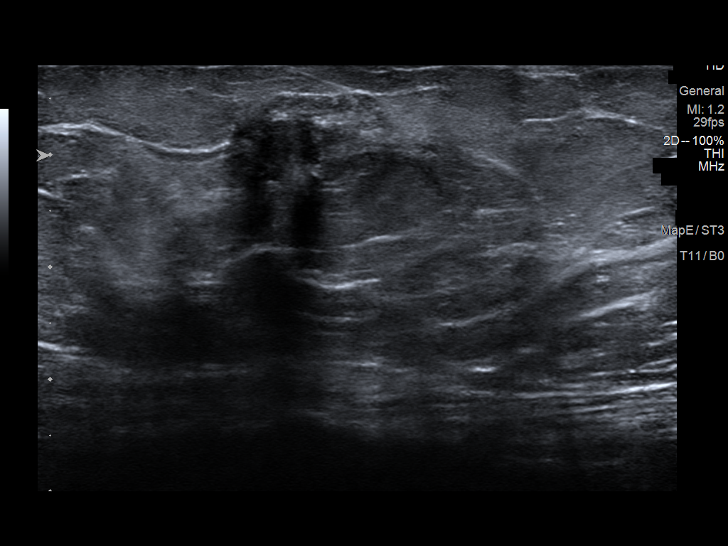
[im 6/12]
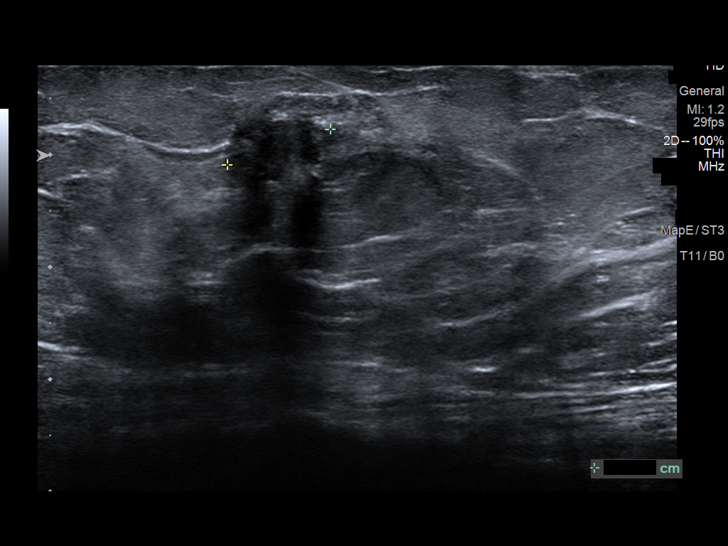
[im 7/12]
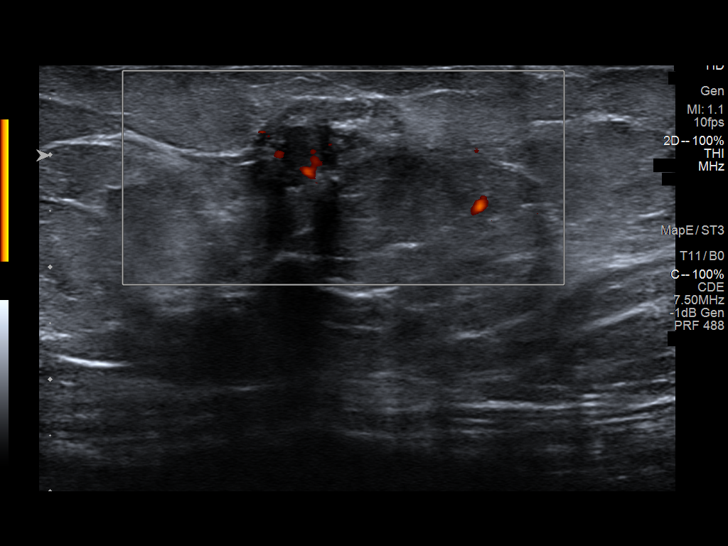
[im 8/12]
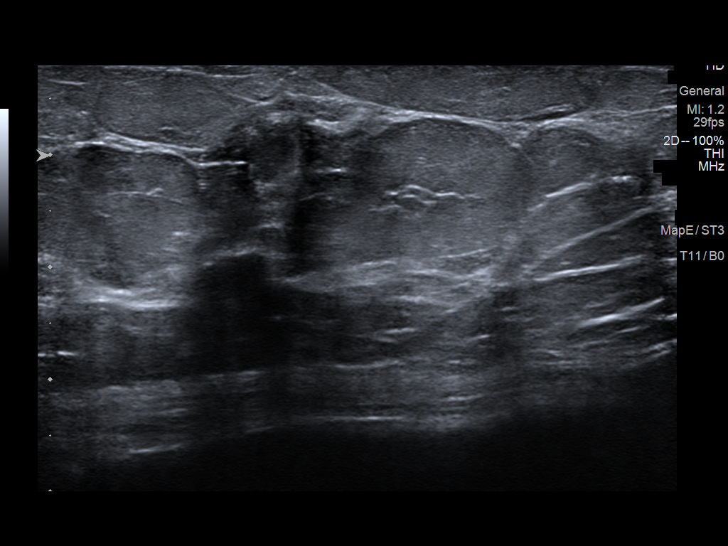
[im 9/12]
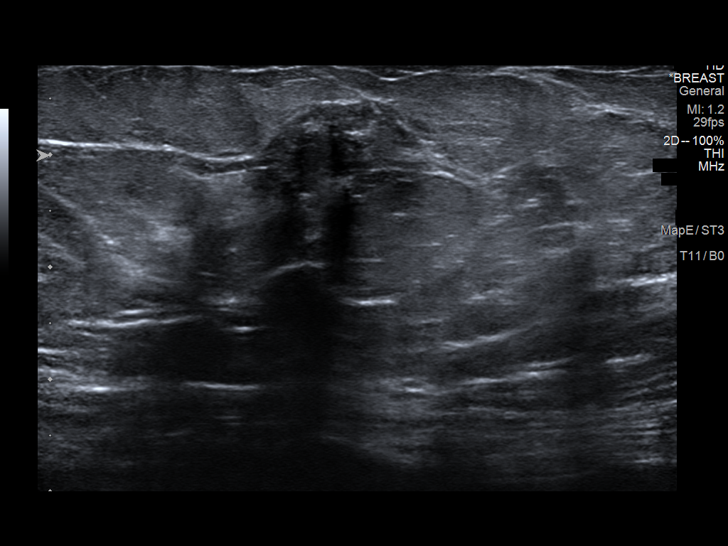
[im 10/12]
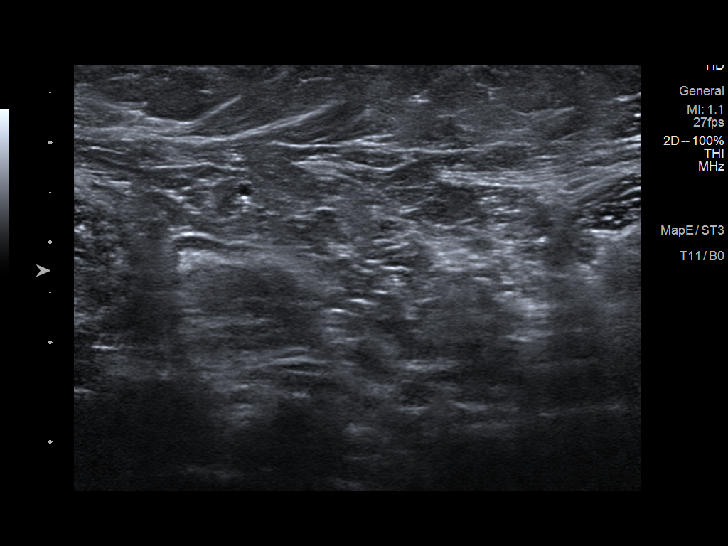
[im 11/12]
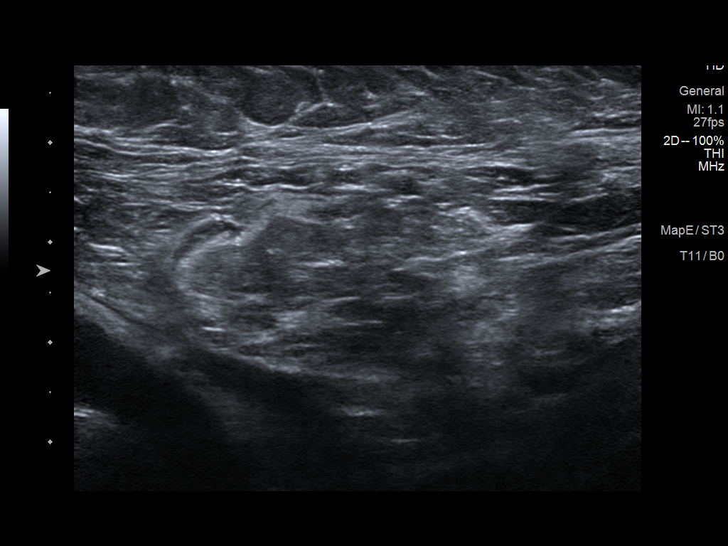
[im 12/12]
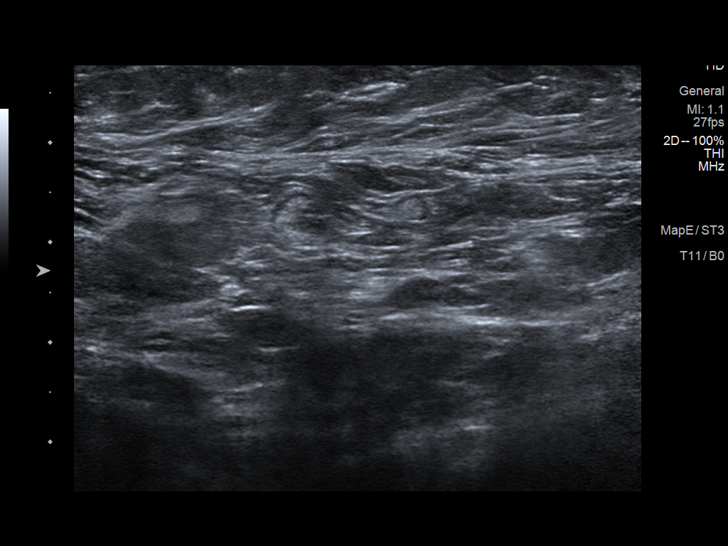

[12 of 12 positions shown; findings below may reference images not displayed]

ACR Breast Density Category b: There are scattered areas of
fibroglandular density.
FINDINGS: No suspicious mammographic findings are identified within the right
breast.

An irregular, focal asymmetry is identified in the central medial
left breast at middle depth. No additional suspicious findings are
identified in the remainder of the left breast.

Mammographic images were processed with CAD.

Targeted ultrasound is performed, showing normal fibroglandular
tissue without focal or suspicious sonographic abnormality.
Evaluation of the retro and periareolar right breast was performed.

Evaluation of the medial left breast demonstrates a single, focally
dilated duct with an associated irregular mass at the 9 o'clock
position 7 cm from the nipple. The duct measures up to 3.4 cm. The
intraductal mass measures 10 x 6 x 6 mm. There is associated
vascularity.

Evaluation of the left axilla demonstrates no suspicious
lymphadenopathy.
IMPRESSION: 1. Suspicious left breast mass in association with a focally dilated
duct. Recommendation is for ultrasound-guided biopsy.
2. No suspicious left axillary lymphadenopathy.
3. No suspicious mammographic or sonographic findings on the right.

RECOMMENDATION:
Ultrasound-guided biopsy of the left breast.

I have discussed the findings and recommendations with the patient.
If applicable, a reminder letter will be sent to the patient
regarding the next appointment.

BI-RADS CATEGORY  4: Suspicious.

## 2020-12-07 IMAGING — MG DIGITAL DIAGNOSTIC BILAT W/ TOMO W/ CAD
8 of 14 series · 8 of 40 positions shown · non-contrast
Comparison: None.

CLINICAL DATA: 74-year-old female with a physician palpated right
breast lump.

EXAM:
DIGITAL DIAGNOSTIC BILATERAL MAMMOGRAM WITH CAD AND TOMO
ULTRASOUND BILATERAL BREAST

[L MLO synth-2D (1 of 2)]
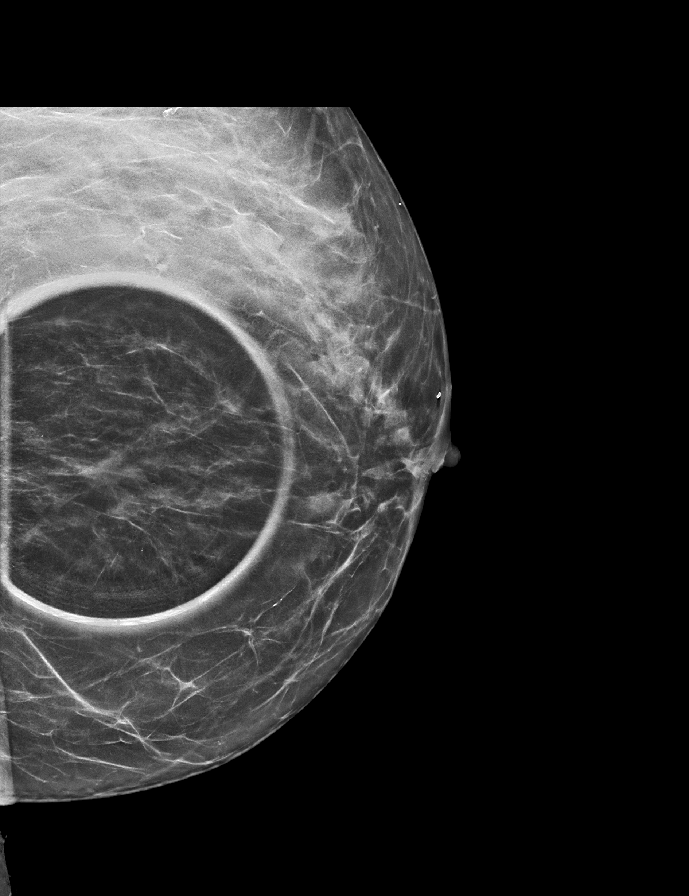

[L CC synth-2D (1 of 2)]
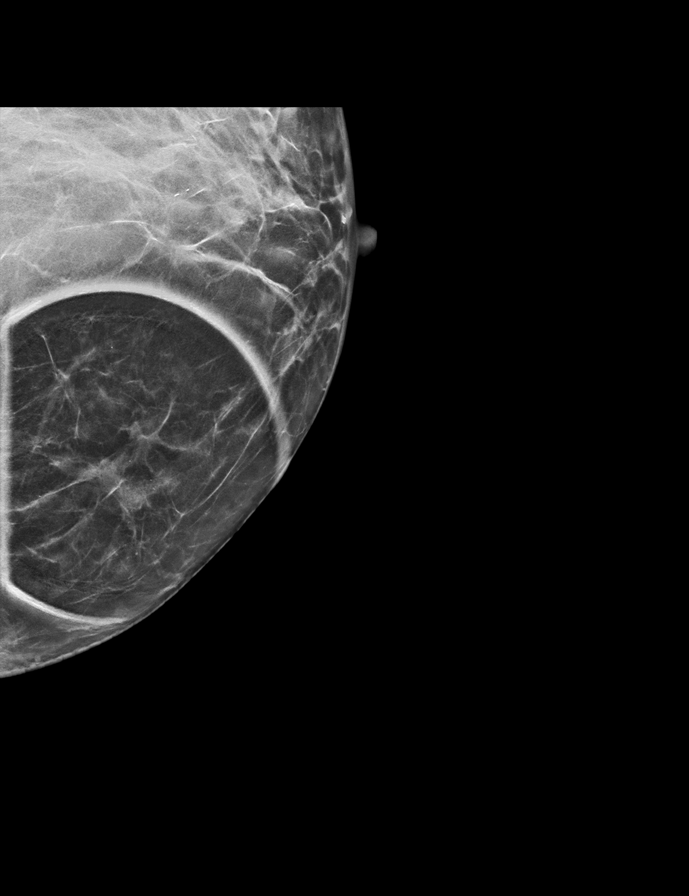

[R CC synth-2D]
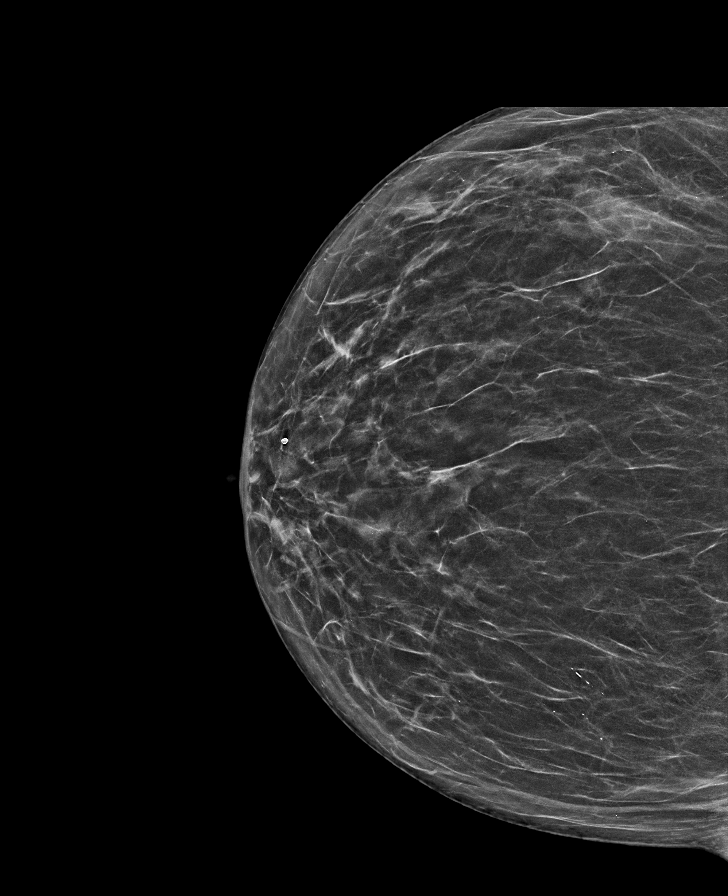

[L CC synth-2D (2 of 2)]
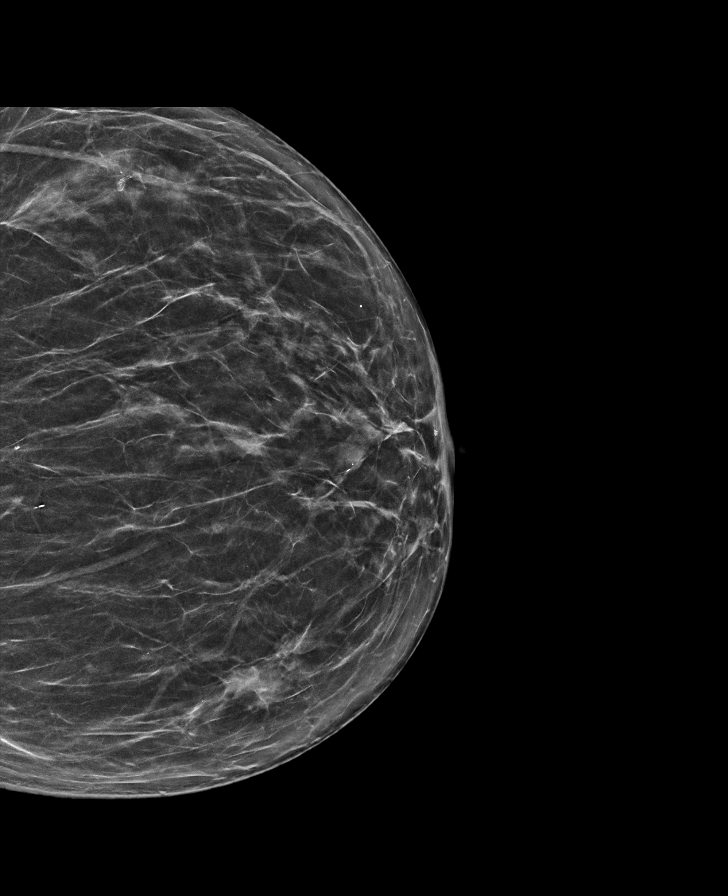

[R MLO synth-2D (1 of 2)]
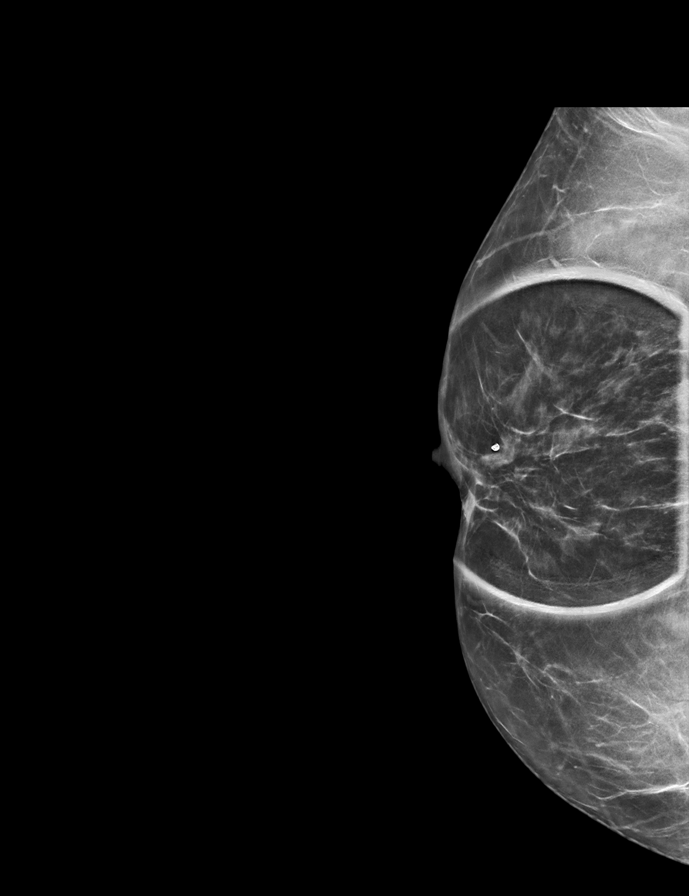

[R MLO synth-2D (2 of 2)]
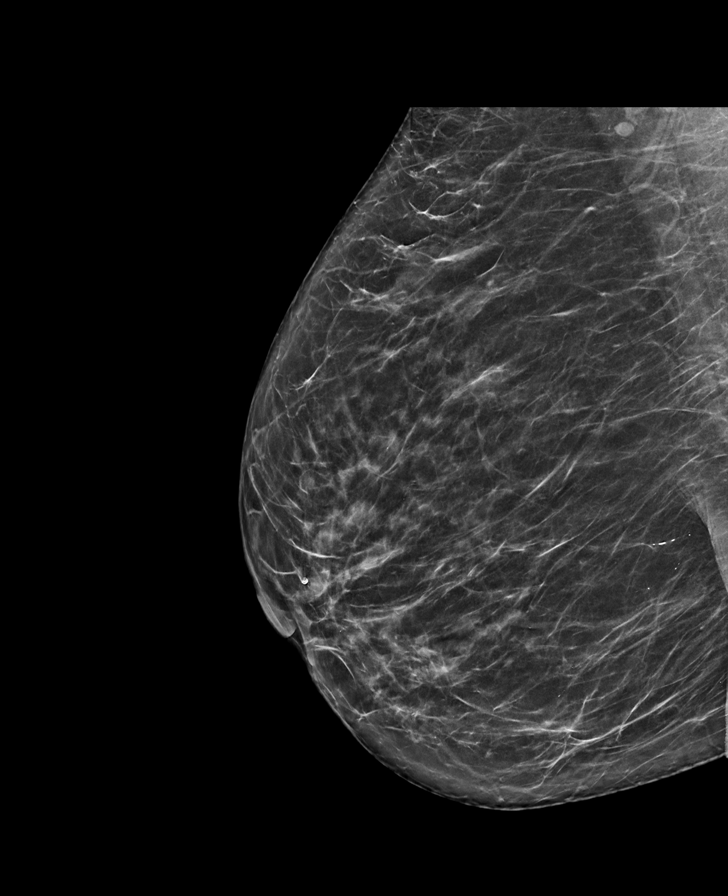

[L MLO synth-2D (2 of 2)]
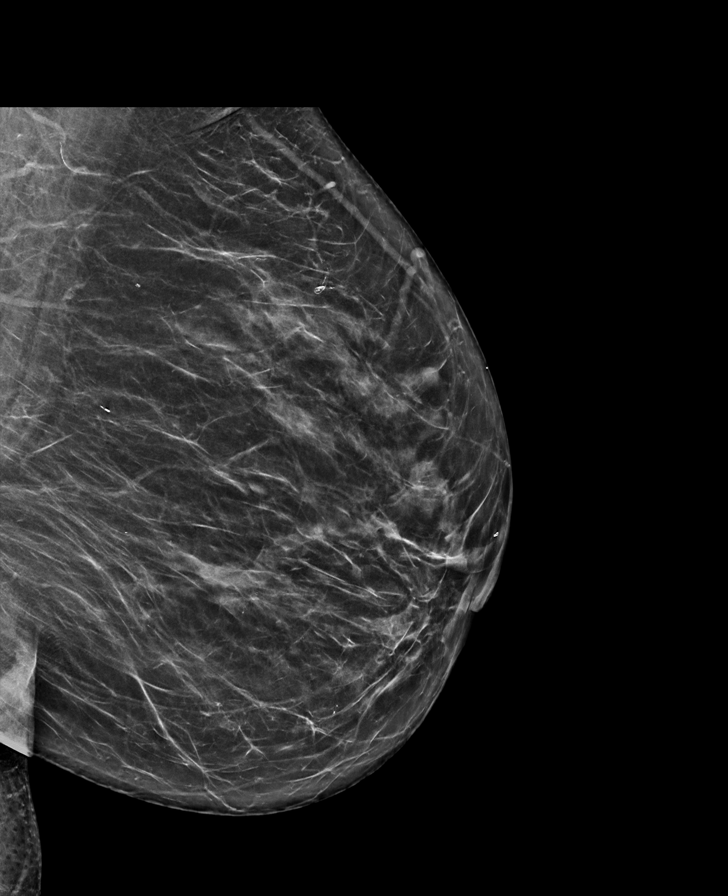

[L CC tomo · tomo slice 35/69.0]
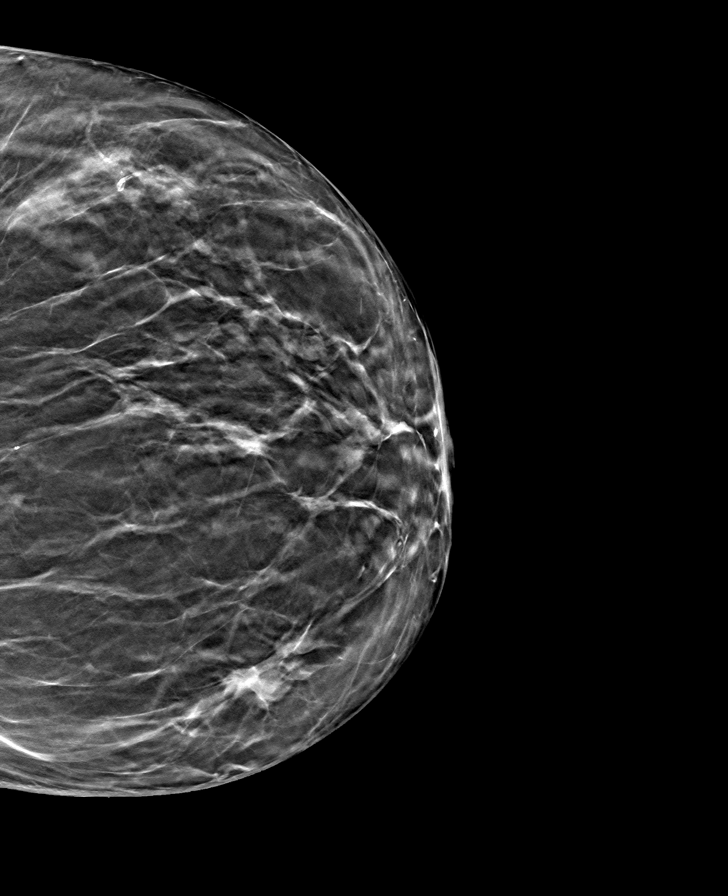

[8 of 40 positions shown; findings below may reference images not displayed]

ACR Breast Density Category b: There are scattered areas of
fibroglandular density.
FINDINGS: No suspicious mammographic findings are identified within the right
breast.

An irregular, focal asymmetry is identified in the central medial
left breast at middle depth. No additional suspicious findings are
identified in the remainder of the left breast.

Mammographic images were processed with CAD.

Targeted ultrasound is performed, showing normal fibroglandular
tissue without focal or suspicious sonographic abnormality.
Evaluation of the retro and periareolar right breast was performed.

Evaluation of the medial left breast demonstrates a single, focally
dilated duct with an associated irregular mass at the 9 o'clock
position 7 cm from the nipple. The duct measures up to 3.4 cm. The
intraductal mass measures 10 x 6 x 6 mm. There is associated
vascularity.

Evaluation of the left axilla demonstrates no suspicious
lymphadenopathy.
IMPRESSION: 1. Suspicious left breast mass in association with a focally dilated
duct. Recommendation is for ultrasound-guided biopsy.
2. No suspicious left axillary lymphadenopathy.
3. No suspicious mammographic or sonographic findings on the right.

RECOMMENDATION:
Ultrasound-guided biopsy of the left breast.

I have discussed the findings and recommendations with the patient.
If applicable, a reminder letter will be sent to the patient
regarding the next appointment.

BI-RADS CATEGORY  4: Suspicious.

## 2020-12-27 DIAGNOSIS — R1013 Epigastric pain: Secondary | ICD-10-CM | POA: Diagnosis not present

## 2020-12-27 DIAGNOSIS — E538 Deficiency of other specified B group vitamins: Secondary | ICD-10-CM | POA: Diagnosis not present

## 2020-12-27 DIAGNOSIS — R350 Frequency of micturition: Secondary | ICD-10-CM | POA: Diagnosis not present

## 2021-03-20 DIAGNOSIS — C50912 Malignant neoplasm of unspecified site of left female breast: Secondary | ICD-10-CM | POA: Diagnosis not present

## 2021-04-23 DIAGNOSIS — H524 Presbyopia: Secondary | ICD-10-CM | POA: Diagnosis not present

## 2021-04-23 DIAGNOSIS — H2513 Age-related nuclear cataract, bilateral: Secondary | ICD-10-CM | POA: Diagnosis not present

## 2021-04-23 DIAGNOSIS — H5213 Myopia, bilateral: Secondary | ICD-10-CM | POA: Diagnosis not present

## 2021-04-23 DIAGNOSIS — H52223 Regular astigmatism, bilateral: Secondary | ICD-10-CM | POA: Diagnosis not present

## 2021-05-17 DIAGNOSIS — R350 Frequency of micturition: Secondary | ICD-10-CM | POA: Diagnosis not present

## 2021-05-17 DIAGNOSIS — E782 Mixed hyperlipidemia: Secondary | ICD-10-CM | POA: Diagnosis not present

## 2021-05-17 DIAGNOSIS — I10 Essential (primary) hypertension: Secondary | ICD-10-CM | POA: Diagnosis not present

## 2021-05-17 DIAGNOSIS — E538 Deficiency of other specified B group vitamins: Secondary | ICD-10-CM | POA: Diagnosis not present

## 2021-05-17 DIAGNOSIS — R1013 Epigastric pain: Secondary | ICD-10-CM | POA: Diagnosis not present

## 2021-05-17 DIAGNOSIS — E559 Vitamin D deficiency, unspecified: Secondary | ICD-10-CM | POA: Diagnosis not present

## 2021-05-17 DIAGNOSIS — N6459 Other signs and symptoms in breast: Secondary | ICD-10-CM | POA: Diagnosis not present

## 2021-05-23 DIAGNOSIS — Z0000 Encounter for general adult medical examination without abnormal findings: Secondary | ICD-10-CM | POA: Diagnosis not present

## 2021-05-23 DIAGNOSIS — I10 Essential (primary) hypertension: Secondary | ICD-10-CM | POA: Diagnosis not present

## 2021-05-23 DIAGNOSIS — E559 Vitamin D deficiency, unspecified: Secondary | ICD-10-CM | POA: Diagnosis not present

## 2021-05-23 DIAGNOSIS — E538 Deficiency of other specified B group vitamins: Secondary | ICD-10-CM | POA: Diagnosis not present

## 2021-05-23 DIAGNOSIS — E782 Mixed hyperlipidemia: Secondary | ICD-10-CM | POA: Diagnosis not present

## 2021-06-13 ENCOUNTER — Ambulatory Visit
Admission: RE | Admit: 2021-06-13 | Discharge: 2021-06-13 | Disposition: A | Discharge status: Home / Self Care | Payer: Medicare Other | Source: Ambulatory Visit | Attending: Hematology and Oncology | Admitting: Hematology and Oncology

## 2021-06-13 ENCOUNTER — Other Ambulatory Visit: Payer: Self-pay | Admitting: Hematology and Oncology

## 2021-06-13 ENCOUNTER — Other Ambulatory Visit: Payer: Self-pay

## 2021-06-13 DIAGNOSIS — Z170 Estrogen receptor positive status [ER+]: Secondary | ICD-10-CM

## 2021-06-13 DIAGNOSIS — C50212 Malignant neoplasm of upper-inner quadrant of left female breast: Secondary | ICD-10-CM

## 2021-06-13 DIAGNOSIS — R922 Inconclusive mammogram: Secondary | ICD-10-CM | POA: Diagnosis not present

## 2021-06-13 DIAGNOSIS — Z853 Personal history of malignant neoplasm of breast: Secondary | ICD-10-CM | POA: Diagnosis not present

## 2021-06-20 ENCOUNTER — Other Ambulatory Visit: Payer: Self-pay

## 2021-06-20 ENCOUNTER — Other Ambulatory Visit: Payer: Self-pay | Admitting: Hematology and Oncology

## 2021-06-20 ENCOUNTER — Ambulatory Visit
Admission: RE | Admit: 2021-06-20 | Discharge: 2021-06-20 | Disposition: A | Discharge status: Home / Self Care | Payer: Medicare Other | Source: Ambulatory Visit | Attending: Hematology and Oncology | Admitting: Hematology and Oncology

## 2021-06-20 DIAGNOSIS — Z170 Estrogen receptor positive status [ER+]: Secondary | ICD-10-CM

## 2021-06-20 DIAGNOSIS — C50212 Malignant neoplasm of upper-inner quadrant of left female breast: Secondary | ICD-10-CM

## 2021-06-20 DIAGNOSIS — N6315 Unspecified lump in the right breast, overlapping quadrants: Secondary | ICD-10-CM | POA: Diagnosis not present

## 2021-06-20 DIAGNOSIS — D241 Benign neoplasm of right breast: Secondary | ICD-10-CM | POA: Diagnosis not present

## 2021-07-04 ENCOUNTER — Other Ambulatory Visit: Payer: Self-pay | Admitting: Hematology and Oncology

## 2021-09-29 DIAGNOSIS — K047 Periapical abscess without sinus: Secondary | ICD-10-CM | POA: Diagnosis not present

## 2021-11-06 ENCOUNTER — Encounter (HOSPITAL_COMMUNITY): Payer: Self-pay

## 2021-11-23 NOTE — Progress Notes (Incomplete)
? ?Patient Care Team: ?Lawerance Cruel, MD as PCP - General (Family Medicine) ?Mauro Kaufmann, RN as Oncology Nurse Navigator ?Rockwell Germany, RN as Oncology Nurse Navigator ? ?DIAGNOSIS: No diagnosis found. ? ?SUMMARY OF ONCOLOGIC HISTORY: ?Oncology History  ?Malignant neoplasm of upper-inner quadrant of left breast in female, estrogen receptor positive ([place])  ?07/06/2020 Initial Diagnosis  ? Patient's physician palpated a right breast mass. Mammogram showed no right breast abnormalities, and a 1.0cm left breast mass at the 9 o'clock position with a focally dilated duct, 3.4cm, and no suspicious axillary adenopathy. Biopsy showed IDC, grade 2, HER-2 negative (0), ER/PR+ 100%, Ki67 30%. ?  ?07/12/2020 Cancer Staging  ? Staging form: Breast, AJCC 8th Edition ?- Clinical stage from 07/12/2020: Stage IA (cT1b, cN0, cM0, G2, ER+, PR+, HER2-) - Signed by Nicholas Lose, MD on 07/12/2020 ? ?  ?08/10/2020 Surgery  ? Left lumpectomy Ninfa Linden): IDC, grade 2, 0.9cm, one left axillary lymph node negative for carcinoma. ?  ? ? ?CHIEF COMPLIANT: Follow-up of left breast cancer on anastrozole ? ?INTERVAL HISTORY: Christina [name] is a 76 y.o. with above-mentioned history of left breast cancer. She underwent a left lumpectomy and is currently on antiestrogen therapy with anastrozole. Mammogram on 06/13/2021 showed suspicious mass in the 9 o'clock region of the right breast 6 cm from the nipple. She presents to the clinic today for follow-up.  ? ?ALLERGIES:  is allergic to peanut-containing drug products, nsaids, pork-derived products, synthroid [levothyroxine sodium], tylenol [acetaminophen], and valium [diazepam]. ? ?MEDICATIONS:  ?Current Outpatient Medications  ?Medication Sig Dispense Refill  ? anastrozole (ARIMIDEX) 1 MG tablet TAKE 1 TABLET BY MOUTH  DAILY 90 tablet 3  ? Apoaequorin (PREVAGEN EXTRA STRENGTH PO) Take by mouth.    ? Ascorbic Acid (VITAMIN C) 1000 MG tablet Take 1,000 mg by mouth daily.    ?  beta carotene 15 MG capsule Take 15 mg by mouth daily. Reported on 12/20/2015    ? Biotin 10000 MCG TABS Take by mouth.    ? BLACK CURRANT SEED OIL PO Take 1,000 mg by mouth.    ? CELEXA 10 MG tablet Take 1 tablet (10 mg total) by mouth 2 (two) times daily. 180 tablet 1  ? cholecalciferol (VITAMIN D) 1000 UNITS tablet Take 5,000 Units by mouth daily.     ? Cyanocobalamin (B-12 IJ) Inject as directed as directed.    ? ferrous sulfate 325 (65 FE) MG tablet Take 325 mg by mouth daily with breakfast.    ? Flaxseed, Linseed, (FLAXSEED OIL) 1000 MG CAPS Take by mouth.    ? folic acid (FOLVITE) 326 MCG tablet Take 800 mcg by mouth daily. Reported on 12/20/2015    ? Ginkgo Biloba 60 MG CAPS Take by mouth.    ? Lactobacillus-Inulin (CULTURELLE DIGESTIVE HEALTH PO) Take 1 capsule by mouth. Reported on 12/20/2015    ? Lecithin 1200 MG CAPS Take 1 capsule by mouth daily.    ? losartan (COZAAR) 50 MG tablet Take 50 mg by mouth daily.    ? Melatonin 3 MG CAPS Take 5 capsules by mouth daily.     ? thiamine (VITAMIN B-1) 100 MG tablet Take 100 mg by mouth daily. Reported on 12/20/2015    ? THYROID PO Take 1 capsule by mouth.    ? traMADol (ULTRAM) 50 MG tablet Take 1 tablet (50 mg total) by mouth every 6 (six) hours as needed for moderate pain or severe pain. (Patient not taking: Reported on 11/27/2020) 25 tablet  0  ? TURMERIC CURCUMIN PO Take by mouth.    ? UNABLE TO FIND Take 1 tablet by mouth daily. Med Name: Ginko biloba    ? vitamin A 10000 UNIT capsule Take 10,000 Units by mouth daily.    ? ?No current facility-administered medications for this visit.  ? ? ?PHYSICAL EXAMINATION: ?ECOG PERFORMANCE STATUS: {CHL ONC ECOG KK:[id number]} ? ?There were no vitals filed for this visit. ?There were no vitals filed for this visit. ? ?BREAST:*** No palpable masses or nodules in either right or left breasts. No palpable axillary supraclavicular or infraclavicular adenopathy no breast tenderness or nipple discharge. (exam performed in the  presence of a chaperone) ? ?LABORATORY DATA:  ?I have reviewed the data as listed ?CMP Latest Ref Rng & Units 07/12/2020 05/22/2012  ?Glucose 70 - 99 mg/dL 92 90  ?BUN 8 - 23 mg/dL 10 7  ?Creatinine 0.44 - 1.00 mg/dL 0.81 0.7  ?Sodium 135 - 145 mmol/L 139 140  ?Potassium 3.5 - 5.1 mmol/L 3.6 3.7  ?Chloride 98 - 111 mmol/L 104 103  ?CO2 22 - 32 mmol/L 25 29  ?Calcium 8.9 - 10.3 mg/dL 9.1 9.0  ?Total Protein 6.5 - 8.1 g/dL 6.9 6.4  ?Total Bilirubin 0.3 - 1.2 mg/dL 0.6 0.5  ?Alkaline Phos 38 - 126 U/L 90 64  ?AST 15 - 41 U/L 16 18  ?ALT 0 - 44 U/L 18 14  ? ? ?Lab Results  ?Component Value Date  ? WBC 6.8 07/12/2020  ? HGB 14.0 07/12/2020  ? HCT 41.6 07/12/2020  ? MCV 86.5 07/12/2020  ? PLT 324 07/12/2020  ? NEUTROABS 4.2 07/12/2020  ? ? ?ASSESSMENT & PLAN:  ?No problem-specific Assessment & Plan notes found for this encounter. ? ? ? ?No orders of the defined types were placed in this encounter. ? ?The patient has a good understanding of the overall plan. she agrees with it. she will call with any problems that may develop before the next visit here. ? ?Total time spent: *** mins including face to face time and time spent for planning, charting and coordination of care ? ?Rulon Eisenmenger, MD, MPH ?11/23/2021 ? ?I, Thana Ates, am acting as scribe for Dr. Nicholas Lose. ? ?{insert scribe attestation} ? ? ? ? ? ?

## 2021-11-25 DIAGNOSIS — R4020 Unspecified coma: Secondary | ICD-10-CM | POA: Diagnosis not present

## 2021-11-26 ENCOUNTER — Inpatient Hospital Stay: Payer: Medicare Other | Admitting: Hematology and Oncology

## 2021-11-26 NOTE — Assessment & Plan Note (Deleted)
estrogen receptor positive ([place]) ?07/06/2020:?Patient's physician palpated a right breast mass. Mammogram showed no right breast abnormalities, and a 1.0cm left breast mass at the 9 o'clock position with a focally dilated duct, 3.4cm, and no suspicious axillary adenopathy. Biopsy showed IDC, grade 2, HER-2 negative (0), ER/PR+ 100%, Ki67 30%. ?? ?08/10/2020:Left lumpectomy Ninfa Linden): IDC, grade 2, 0.9cm, one left axillary lymph node negative for carcinoma. ?? ?Did not undergo radiation given her age and GERD prognostic markers. ?? ?Recommendations: ? Adjuvant antiestrogen therapy with anastrozole 1 mg daily ?Anastrozole toxicities: Tolerating it extremely well without any side effects. ?? ?Breast cancer surveillance: Mammograms will be done in September 2022 ?Return to clinic in 1 year for follow-up ?

## 2021-12-22 DIAGNOSIS — 419620001 Death: Secondary | SNOMED CT | POA: Diagnosis not present

## 2021-12-22 DEATH — deceased

## 2024-06-07 NOTE — Progress Notes (Signed)
Orders placed in this encounter.
